# Patient Record
Sex: Male | Born: 1975 | Race: White | Hispanic: No | Marital: Single | Smoking: Never smoker
Health system: Southern US, Community
[De-identification: ages and names within clinical notes are randomized; demographics above are authoritative.]

## PROBLEM LIST (undated history)

## (undated) DIAGNOSIS — E785 Hyperlipidemia, unspecified: Secondary | ICD-10-CM

## (undated) DIAGNOSIS — K219 Gastro-esophageal reflux disease without esophagitis: Secondary | ICD-10-CM

## (undated) DIAGNOSIS — K921 Melena: Secondary | ICD-10-CM

## (undated) DIAGNOSIS — R748 Abnormal levels of other serum enzymes: Secondary | ICD-10-CM

## (undated) DIAGNOSIS — K5900 Constipation, unspecified: Secondary | ICD-10-CM

## (undated) DIAGNOSIS — T7840XA Allergy, unspecified, initial encounter: Secondary | ICD-10-CM

## (undated) HISTORY — DX: Hyperlipidemia, unspecified: E78.5

## (undated) HISTORY — DX: Gastro-esophageal reflux disease without esophagitis: K21.9

## (undated) HISTORY — DX: Constipation, unspecified: K59.00

## (undated) HISTORY — DX: Allergy, unspecified, initial encounter: T78.40XA

## (undated) HISTORY — DX: Abnormal levels of other serum enzymes: R74.8

## (undated) HISTORY — DX: Melena: K92.1

## (undated) HISTORY — PX: OTHER SURGICAL HISTORY: SHX169

---

## 1997-12-17 DIAGNOSIS — K921 Melena: Secondary | ICD-10-CM

## 1997-12-17 HISTORY — DX: Melena: K92.1

## 2004-12-27 ENCOUNTER — Ambulatory Visit: Payer: Self-pay | Admitting: Internal Medicine

## 2005-01-02 ENCOUNTER — Ambulatory Visit: Payer: Self-pay | Admitting: Internal Medicine

## 2005-01-23 ENCOUNTER — Ambulatory Visit: Payer: Self-pay | Admitting: Gastroenterology

## 2005-03-02 ENCOUNTER — Ambulatory Visit: Payer: Self-pay | Admitting: Internal Medicine

## 2005-04-30 ENCOUNTER — Ambulatory Visit: Payer: Self-pay | Admitting: Gastroenterology

## 2005-05-15 ENCOUNTER — Emergency Department (HOSPITAL_COMMUNITY): Admission: EM | Admit: 2005-05-15 | Discharge: 2005-05-16 | Payer: Self-pay | Admitting: Emergency Medicine

## 2006-12-09 ENCOUNTER — Emergency Department (HOSPITAL_COMMUNITY): Admission: EM | Admit: 2006-12-09 | Discharge: 2006-12-09 | Payer: Self-pay | Admitting: Emergency Medicine

## 2006-12-11 ENCOUNTER — Ambulatory Visit: Payer: Self-pay | Admitting: Gastroenterology

## 2009-01-07 ENCOUNTER — Ambulatory Visit: Payer: Self-pay | Admitting: Internal Medicine

## 2009-01-07 DIAGNOSIS — K5900 Constipation, unspecified: Secondary | ICD-10-CM | POA: Insufficient documentation

## 2009-01-07 LAB — CONVERTED CEMR LAB
ALT: 38 units/L (ref 0–53)
AST: 24 units/L (ref 0–37)
Albumin: 4.3 g/dL (ref 3.5–5.2)
Alkaline Phosphatase: 47 units/L (ref 39–117)
BUN: 16 mg/dL (ref 6–23)
Basophils Absolute: 0 10*3/uL (ref 0.0–0.1)
Basophils Relative: 0.5 % (ref 0.0–3.0)
Bilirubin, Direct: 0.1 mg/dL (ref 0.0–0.3)
CO2: 27 meq/L (ref 19–32)
Calcium: 9.5 mg/dL (ref 8.4–10.5)
Chloride: 103 meq/L (ref 96–112)
Cholesterol: 217 mg/dL (ref 0–200)
Creatinine, Ser: 0.8 mg/dL (ref 0.4–1.5)
Direct LDL: 158.8 mg/dL
Eosinophils Absolute: 0.2 10*3/uL (ref 0.0–0.7)
Eosinophils Relative: 3.6 % (ref 0.0–5.0)
GFR calc Af Amer: 146 mL/min
GFR calc non Af Amer: 121 mL/min
Glucose, Bld: 97 mg/dL (ref 70–99)
HCT: 43.5 % (ref 39.0–52.0)
HDL: 40.1 mg/dL (ref >39.0)
Hemoglobin: 14.8 g/dL (ref 13.0–17.0)
Lymphocytes Relative: 30 % (ref 12.0–46.0)
MCHC: 34.1 g/dL (ref 30.0–36.0)
MCV: 87 fL (ref 78.0–100.0)
Monocytes Absolute: 0.4 10*3/uL (ref 0.1–1.0)
Monocytes Relative: 6.5 % (ref 3.0–12.0)
Neutro Abs: 3.5 10*3/uL (ref 1.4–7.7)
Neutrophils Relative %: 59.4 % (ref 43.0–77.0)
Platelets: 261 10*3/uL (ref 150–400)
Potassium: 4 meq/L (ref 3.5–5.1)
RBC: 5 M/uL (ref 4.22–5.81)
RDW: 11.9 % (ref 11.5–14.6)
Sodium: 139 meq/L (ref 135–145)
TSH: 1.24 microintl units/mL (ref 0.35–5.50)
Total Bilirubin: 1 mg/dL (ref 0.3–1.2)
Total CHOL/HDL Ratio: 5.4
Total Protein: 7.4 g/dL (ref 6.0–8.3)
Triglycerides: 110 mg/dL (ref 0–149)
VLDL: 22 mg/dL (ref 0–40)
WBC: 5.8 10*3/uL (ref 4.5–10.5)

## 2011-05-04 NOTE — Assessment & Plan Note (Signed)
[facility]                         GASTROENTEROLOGY OFFICE NOTE   NAME:Stephen Gomez, Stephen Gomez                         MRN:          [id number]  DATE:12/11/2006                            DOB:          [birth year]    Stephen Gomez is a 35 year old white male that I previously evaluated for  constipation and small volume rectal bleeding.  See the last note from  Apr 30, 2005.  He underwent flexible sigmoidoscopy in February of 1999  which was normal.  He presented to [facility] Emergency Room on  December 09, 2006 with diarrhea, lower abdominal cramping, and rectal  bleeding.  His white count was elevated at 12.5 and his glucose was  elevated at 122, and the remainder of his blood work was unremarkable.  He was felt to have a gastroenteritis and hemorrhoids by the ED  physician.  He was treated with Cipro 500 mg p.o. b.i.d. and was  recommended to follow up with his primary physician, Dr. Artist Pais.  He has  had almost complete resolution of all his symptoms.  He continues on the  Cipro as prescribed.  He has no diarrhea, rectal bleeding, or abdominal  pain.  He has been gradually advancing his diet over the past 24 hours.  He notes no fevers or chills.   CURRENT MEDICATIONS:  Cipro 500 mg p.o. b.i.d.   MEDICATION ALLERGIES:  NONE KNOWN.   EXAMINATION:  In no acute distress, weight 152.2 pounds, blood pressure  124/64, pulse 82 and regular.  CHEST:  Clear to auscultation bilaterally.  CARDIAC:  Regular rate and rhythm without murmurs.  ABDOMEN:  Soft and nontender with normoactive bowel sounds, no palpable  organomegaly, masses, or hernias.   ASSESSMENT/PLAN:  1. Presumed recent infectious enterocolitis with associated bleeding-      possibly from hemrrhoids or colitis.  Since his symptoms have      completely resolved we will not plan for further work up.  If has      any return in symptoms we will plan for a colonoscopy for further      evaluation.  He  will call if his symptoms do not completely resolve      or they recur.  Complete Cipro as prescribed by the emergency room.  2. History of gastroesophageal reflux disease.  Symptoms currently      inactive.     Venita Lick. Russella Dar, MD, [facility]  Electronically Signed    MTS/MedQ  DD: 12/13/2006  DT: 12/13/2006  Job #: [id number]   cc:   Barbette Hair. Artist Pais, DO

## 2011-09-14 ENCOUNTER — Telehealth: Payer: Self-pay | Admitting: Internal Medicine

## 2011-09-14 NOTE — Telephone Encounter (Signed)
Pt wanted to come in for an injection with Dr Artist Pais pt wanted a TD shot  Should he get the Tdap? Also when can this be scheduled? Please advise

## 2011-09-17 NOTE — Telephone Encounter (Signed)
Pt needs tdap and he can come in at 1 pm.  May need to add an injection slot on Dr Olegario Messier schedule

## 2011-09-19 ENCOUNTER — Ambulatory Visit (INDEPENDENT_AMBULATORY_CARE_PROVIDER_SITE_OTHER): Payer: BC Managed Care – PPO | Admitting: Internal Medicine

## 2011-09-19 DIAGNOSIS — Z23 Encounter for immunization: Secondary | ICD-10-CM

## 2011-10-11 ENCOUNTER — Other Ambulatory Visit (INDEPENDENT_AMBULATORY_CARE_PROVIDER_SITE_OTHER): Payer: BC Managed Care – PPO

## 2011-10-11 DIAGNOSIS — Z0000 Encounter for general adult medical examination without abnormal findings: Secondary | ICD-10-CM

## 2011-10-11 LAB — LDL CHOLESTEROL, DIRECT: Direct LDL: 153 mg/dL

## 2011-10-11 LAB — CBC WITH DIFFERENTIAL/PLATELET
Basophils Absolute: 0.1 10*3/uL (ref 0.0–0.1)
Eosinophils Absolute: 0.2 10*3/uL (ref 0.0–0.7)
HCT: 40.4 % (ref 39.0–52.0)
Hemoglobin: 13.8 g/dL (ref 13.0–17.0)
Lymphs Abs: 1.7 10*3/uL (ref 0.7–4.0)
MCHC: 34.1 g/dL (ref 30.0–36.0)
Neutro Abs: 4.9 10*3/uL (ref 1.4–7.7)
RDW: 12.6 % (ref 11.5–14.6)

## 2011-10-11 LAB — POCT URINALYSIS DIPSTICK
Glucose, UA: NEGATIVE
Ketones, UA: NEGATIVE
Spec Grav, UA: 1.025
Urobilinogen, UA: 0.2

## 2011-10-11 LAB — HEPATIC FUNCTION PANEL
AST: 35 U/L (ref 0–37)
Albumin: 4.2 g/dL (ref 3.5–5.2)
Total Protein: 7.5 g/dL (ref 6.0–8.3)

## 2011-10-11 LAB — BASIC METABOLIC PANEL
CO2: 28 mEq/L (ref 19–32)
Chloride: 102 mEq/L (ref 96–112)
Glucose, Bld: 91 mg/dL (ref 70–99)
Potassium: 4.2 mEq/L (ref 3.5–5.1)
Sodium: 140 mEq/L (ref 135–145)

## 2011-10-11 LAB — LIPID PANEL
Cholesterol: 210 mg/dL — ABNORMAL HIGH (ref 0–200)
HDL: 43.6 mg/dL (ref >39.00)
VLDL: 37 mg/dL (ref 0.0–40.0)

## 2011-10-18 ENCOUNTER — Encounter: Payer: Self-pay | Admitting: Internal Medicine

## 2011-10-29 ENCOUNTER — Encounter: Payer: Self-pay | Admitting: Internal Medicine

## 2011-11-02 ENCOUNTER — Ambulatory Visit (INDEPENDENT_AMBULATORY_CARE_PROVIDER_SITE_OTHER): Payer: BC Managed Care – PPO | Admitting: Internal Medicine

## 2011-11-02 ENCOUNTER — Encounter: Payer: Self-pay | Admitting: Internal Medicine

## 2011-11-02 DIAGNOSIS — Z0000 Encounter for general adult medical examination without abnormal findings: Secondary | ICD-10-CM

## 2011-11-02 DIAGNOSIS — J309 Allergic rhinitis, unspecified: Secondary | ICD-10-CM | POA: Insufficient documentation

## 2011-11-02 NOTE — Assessment & Plan Note (Addendum)
Patient seen at urgent care for chronic cough which responded well to antihistamine and nasal steroids. We discussed potential testing for allergies next year.  (Blood test)  Pt currently asymptomatic. Patient advised to discontinue Flonase for now but use intranasal saline as maintenance treatment.

## 2011-11-02 NOTE — Assessment & Plan Note (Signed)
Reviewed adult health maintenance protocols. Patient is up-to-date with adult vaccines. Despite regular exercise patient's lipid panel is still elevated. Handout for following a low saturated fat diet provided. Obtain high sensitivity CRP at next lab visit.

## 2011-11-02 NOTE — Progress Notes (Signed)
  Subjective:    Patient ID: Stephen Gomez, male    DOB: 02/22/1976, 35 y.o.   MRN: [id number]  HPI 35 year old white male for routine physical.  No significant interval medical hx.  He reports treatment for chronic cough at urgent care.  His symptoms responded well to antihistamine and intranasal steroids. Cough attributed to allergic rhinitis.  He denies previous allergy testing. He does not recall whether symptoms are seasonal.  Lipid panel and other blood work reviewed in detail with patient.  Review of Systems  Constitutional: Negative for activity change, appetite change and unexpected weight change.  Eyes: Negative for visual disturbance.  Respiratory: Negative for cough, chest tightness and shortness of breath.   Cardiovascular: Negative for chest pain.  Genitourinary: Negative for difficulty urinating.  Neurological: Negative for headaches.  Gastrointestinal: Negative for abdominal pain, heartburn melena or hematochezia Psych: Negative for depression or anxiety Past Medical History  Diagnosis Date  . GERD (gastroesophageal reflux disease)   . Hematochezia 1999    History   Social History  . Marital Status: Married    Spouse Name: N/A    Number of Children: N/A  . Years of Education: N/A   Occupational History  . Not on file.   Social History Main Topics  . Smoking status: Never Smoker   . Smokeless tobacco: Not on file  . Alcohol Use: Yes  . Drug Use:   . Sexually Active:    Other Topics Concern  . Not on file   Social History Narrative  . No narrative on file    No past surgical history on file.  Family History  Problem Relation Age of Onset  . Hypertension Mother   . Thyroid disease Mother   . Hypertension Father   . Cancer Cousin     thyroid    No Known Allergies  No current outpatient prescriptions on file prior to visit.    BP 124/80  Pulse 114  Temp(Src) 98.2 F (36.8 C) (Oral)  Ht 5\' 6"  (1.676 m)  Wt 164 lb (74.39 kg)  BMI 26.47  kg/m2       Objective:   Physical Exam  Constitutional: He is oriented to person, place, and time. He appears well-developed and well-nourished.  HENT:  Head: Normocephalic and atraumatic.  Right Ear: External ear normal.  Left Ear: External ear normal.  Nose: Nose normal.  Mouth/Throat: Oropharynx is clear and moist.  Eyes: Conjunctivae are normal. Pupils are equal, round, and reactive to light.  Neck: Normal range of motion. Neck supple.  Cardiovascular: Normal rate, regular rhythm and normal heart sounds.  Exam reveals no gallop and no friction rub.   No murmur heard. Pulmonary/Chest: Effort normal and breath sounds normal. No respiratory distress. He has no wheezes.  Abdominal: Soft. Bowel sounds are normal. He exhibits no mass. There is no tenderness. There is no rebound.  Musculoskeletal: Normal range of motion.  Lymphadenopathy:    He has no cervical adenopathy.  Neurological: He is alert and oriented to person, place, and time.  Skin: Skin is warm and dry.  Psychiatric: He has a normal mood and affect. His behavior is normal.       Assessment & Plan:

## 2011-11-02 NOTE — Patient Instructions (Signed)
Please follow low saturated fat diet Please complete the following lab tests within 3 months LFTs- 790.4

## 2012-02-28 ENCOUNTER — Other Ambulatory Visit (INDEPENDENT_AMBULATORY_CARE_PROVIDER_SITE_OTHER): Payer: BC Managed Care – PPO

## 2012-02-28 LAB — HEPATIC FUNCTION PANEL
Albumin: 4.3 g/dL (ref 3.5–5.2)
Total Protein: 7.5 g/dL (ref 6.0–8.3)

## 2012-03-07 ENCOUNTER — Ambulatory Visit (INDEPENDENT_AMBULATORY_CARE_PROVIDER_SITE_OTHER): Payer: BC Managed Care – PPO | Admitting: Internal Medicine

## 2012-03-07 ENCOUNTER — Encounter: Payer: Self-pay | Admitting: Internal Medicine

## 2012-03-07 VITALS — BP 110/80 | Temp 98.0°F | Wt 162.0 lb

## 2012-03-07 DIAGNOSIS — L708 Other acne: Secondary | ICD-10-CM

## 2012-03-07 DIAGNOSIS — L989 Disorder of the skin and subcutaneous tissue, unspecified: Secondary | ICD-10-CM | POA: Insufficient documentation

## 2012-03-07 DIAGNOSIS — L819 Disorder of pigmentation, unspecified: Secondary | ICD-10-CM

## 2012-03-07 DIAGNOSIS — L709 Acne, unspecified: Secondary | ICD-10-CM | POA: Insufficient documentation

## 2012-03-07 MED ORDER — MINOCYCLINE HCL 50 MG PO CAPS: 50.0000 mg | ORAL_CAPSULE | Freq: Every day | ORAL | Status: DC

## 2012-03-07 NOTE — Progress Notes (Signed)
  Subjective:    Patient ID: Stephen Gomez, male    DOB: [birth year], 36 y.o.   MRN: [id number]  HPI  36 year old white male complains of enlarging hyperpigmented lesions on his back. No other associated symptoms.  Patient has also struggled with acne on his back. He has used over-the-counter body washes without any significant improvement. He has not tried using antibiotics in the past. He denies previous reactions to tetracycline.  Past Medical History  Diagnosis Date  . GERD (gastroesophageal reflux disease)   . Hematochezia 1999    History   Social History  . Marital Status: Married    Spouse Name: N/A    Number of Children: N/A  . Years of Education: N/A   Occupational History  . Not on file.   Social History Main Topics  . Smoking status: Never Smoker   . Smokeless tobacco: Not on file  . Alcohol Use: Yes  . Drug Use:   . Sexually Active:    Other Topics Concern  . Not on file   Social History Narrative  . No narrative on file    Past Surgical History  Procedure Date  . Hyperlipidemia     Family History  Problem Relation Age of Onset  . Hypertension Mother   . Thyroid disease Mother   . Hypertension Father   . Cancer Cousin     thyroid    No Known Allergies  Current Outpatient Prescriptions on File Prior to Visit  Medication Sig Dispense Refill  . cetirizine (ZYRTEC) 10 MG tablet Take 10 mg by mouth daily.        . fluticasone (FLONASE) 50 MCG/ACT nasal spray Place 2 sprays into the nose daily.          BP 110/80  Temp(Src) 98 F (36.7 C) (Oral)  Wt 162 lb (73.483 kg)      Review of Systems     Objective:   Physical Exam  Constitutional: He appears well-developed and well-nourished.  Cardiovascular: Normal rate, regular rhythm and normal heart sounds.   Pulmonary/Chest: Effort normal and breath sounds normal. He has no wheezes.  Skin:          2 lesions - approx 8 mm in size  Scattered cystic acne of his back            Assessment & Plan:

## 2012-03-07 NOTE — Assessment & Plan Note (Signed)
36 year old white male with enlarging skin lesions on his back. Rule out malignancy. She had biopsy performed under sterile technique. 0.2 cc 1% lidocaine with epi used for anesthesia. No complications. Aftercare discussed.

## 2012-03-07 NOTE — Assessment & Plan Note (Signed)
Patient with chronic acne on his back. He has tried and failed over-the-counter body washes. Treat with minocycline 50 mg once daily.  Reassess in one month.

## 2012-03-26 ENCOUNTER — Encounter: Payer: Self-pay | Admitting: Internal Medicine

## 2012-03-26 ENCOUNTER — Telehealth: Payer: Self-pay | Admitting: Internal Medicine

## 2012-03-26 ENCOUNTER — Ambulatory Visit (INDEPENDENT_AMBULATORY_CARE_PROVIDER_SITE_OTHER): Payer: BC Managed Care – PPO | Admitting: Internal Medicine

## 2012-03-26 VITALS — BP 104/80 | Temp 98.1°F | Wt 169.0 lb

## 2012-03-26 DIAGNOSIS — L259 Unspecified contact dermatitis, unspecified cause: Secondary | ICD-10-CM

## 2012-03-26 DIAGNOSIS — I776 Arteritis, unspecified: Secondary | ICD-10-CM

## 2012-03-26 DIAGNOSIS — L309 Dermatitis, unspecified: Secondary | ICD-10-CM | POA: Insufficient documentation

## 2012-03-26 LAB — CBC WITH DIFFERENTIAL/PLATELET
Basophils Relative: 0.1 % (ref 0.0–3.0)
Eosinophils Relative: 1.1 % (ref 0.0–5.0)
HCT: 40.7 % (ref 39.0–52.0)
Hemoglobin: 14.1 g/dL (ref 13.0–17.0)
Lymphs Abs: 1 10*3/uL (ref 0.7–4.0)
MCHC: 34.6 g/dL (ref 30.0–36.0)
MCV: 86.5 fl (ref 78.0–100.0)
Monocytes Absolute: 0.4 10*3/uL (ref 0.1–1.0)
Neutro Abs: 6.6 10*3/uL (ref 1.4–7.7)
Neutrophils Relative %: 82.3 % — ABNORMAL HIGH (ref 43.0–77.0)
RBC: 4.71 Mil/uL (ref 4.22–5.81)
WBC: 8 10*3/uL (ref 4.5–10.5)

## 2012-03-26 LAB — BASIC METABOLIC PANEL
CO2: 25 mEq/L (ref 19–32)
Chloride: 101 mEq/L (ref 96–112)
Potassium: 4.2 mEq/L (ref 3.5–5.1)
Sodium: 138 mEq/L (ref 135–145)

## 2012-03-26 MED ORDER — HYDROXYZINE HCL 10 MG PO TABS: 10.0000 mg | ORAL_TABLET | Freq: Three times a day (TID) | ORAL | Status: AC | PRN

## 2012-03-26 NOTE — Assessment & Plan Note (Addendum)
Unexplained palmar rash and 36 year old white male. Consider vasculitis. Discontinue minocycline. Refer to dermatology for further evaluation and treatment.  Use atarax for pruritus.

## 2012-03-26 NOTE — Patient Instructions (Signed)
Call our office if your rash gets worse. Stop minocycline

## 2012-03-26 NOTE — Telephone Encounter (Signed)
Opened in error. Pt decided to make ov instead.

## 2012-03-26 NOTE — Progress Notes (Signed)
  Subjective:    Patient ID: Stephen Gomez, male    DOB: [birth year], 36 y.o.   MRN: [id number]  HPI  36 year old white male complains of new onset rash mainly on his hands. Rash involves palms. He initially noticed moderate itching. No new environmental exposures. He was started on minocycline approximately 2-3 weeks ago for acne of his back. He also notes mild redness of the right elbow and both knees.  Review of Systems    negative for fever or chills  Past Medical History  Diagnosis Date  . GERD (gastroesophageal reflux disease)   . Hematochezia 1999    History   Social History  . Marital Status: Married    Spouse Name: N/A    Number of Children: N/A  . Years of Education: N/A   Occupational History  . Not on file.   Social History Main Topics  . Smoking status: Never Smoker   . Smokeless tobacco: Not on file  . Alcohol Use: Yes  . Drug Use:   . Sexually Active:    Other Topics Concern  . Not on file   Social History Narrative  . No narrative on file    Past Surgical History  Procedure Date  . Hyperlipidemia     Family History  Problem Relation Age of Onset  . Hypertension Mother   . Thyroid disease Mother   . Hypertension Father   . Cancer Cousin     thyroid    No Known Allergies  Current Outpatient Prescriptions on File Prior to Visit  Medication Sig Dispense Refill  . cetirizine (ZYRTEC) 10 MG tablet Take 10 mg by mouth daily.        . fluticasone (FLONASE) 50 MCG/ACT nasal spray Place 2 sprays into the nose daily.          BP 104/80  Temp(Src) 98.1 F (36.7 C) (Oral)  Wt 169 lb (76.658 kg)    Objective:   Physical Exam  Constitutional: He appears well-developed and well-nourished.  Cardiovascular: Normal rate, regular rhythm and normal heart sounds.   Pulmonary/Chest: Breath sounds normal. He has no wheezes. He has no rales.  Musculoskeletal:       Mild swelling of right hand  Skin:       Erythematous plaques on bilateral palms. Lacy  appearance on thumbs. Mild redness to extensor surface of right elbow and bilateral knees.          Assessment & Plan:

## 2012-03-27 LAB — ANA: Anti Nuclear Antibody(ANA): NEGATIVE

## 2013-08-07 ENCOUNTER — Encounter: Payer: Self-pay | Admitting: Internal Medicine

## 2013-08-07 ENCOUNTER — Other Ambulatory Visit (INDEPENDENT_AMBULATORY_CARE_PROVIDER_SITE_OTHER): Payer: BC Managed Care – PPO

## 2013-08-07 DIAGNOSIS — R7989 Other specified abnormal findings of blood chemistry: Secondary | ICD-10-CM

## 2013-08-07 DIAGNOSIS — Z0000 Encounter for general adult medical examination without abnormal findings: Secondary | ICD-10-CM

## 2013-08-07 LAB — POCT URINALYSIS DIPSTICK
Bilirubin, UA: NEGATIVE
Ketones, UA: NEGATIVE
Leukocytes, UA: NEGATIVE
Nitrite, UA: NEGATIVE
pH, UA: 7.5

## 2013-08-07 LAB — CBC WITH DIFFERENTIAL/PLATELET
Basophils Relative: 0.7 % (ref 0.0–3.0)
Eosinophils Absolute: 0.1 10*3/uL (ref 0.0–0.7)
Eosinophils Relative: 2 % (ref 0.0–5.0)
HCT: 43.3 % (ref 39.0–52.0)
Hemoglobin: 15.1 g/dL (ref 13.0–17.0)
Lymphs Abs: 1.8 10*3/uL (ref 0.7–4.0)
MCHC: 34.8 g/dL (ref 30.0–36.0)
MCV: 85.8 fl (ref 78.0–100.0)
Monocytes Absolute: 0.4 10*3/uL (ref 0.1–1.0)
Neutro Abs: 3.2 10*3/uL (ref 1.4–7.7)
RBC: 5.04 Mil/uL (ref 4.22–5.81)
WBC: 5.6 10*3/uL (ref 4.5–10.5)

## 2013-08-07 LAB — LIPID PANEL
Total CHOL/HDL Ratio: 6
Triglycerides: 214 mg/dL — ABNORMAL HIGH (ref 0.0–149.0)

## 2013-08-07 LAB — BASIC METABOLIC PANEL
CO2: 29 mEq/L (ref 19–32)
Chloride: 101 mEq/L (ref 96–112)
Creatinine, Ser: 0.9 mg/dL (ref 0.4–1.5)
Potassium: 3.8 mEq/L (ref 3.5–5.1)
Sodium: 136 mEq/L (ref 135–145)

## 2013-08-07 LAB — HEPATIC FUNCTION PANEL
Albumin: 4.4 g/dL (ref 3.5–5.2)
Alkaline Phosphatase: 44 U/L (ref 39–117)
Bilirubin, Direct: 0 mg/dL (ref 0.0–0.3)
Total Protein: 7.8 g/dL (ref 6.0–8.3)

## 2013-08-07 LAB — TSH: TSH: 1.21 u[IU]/mL (ref 0.35–5.50)

## 2013-08-14 ENCOUNTER — Ambulatory Visit (INDEPENDENT_AMBULATORY_CARE_PROVIDER_SITE_OTHER): Payer: BC Managed Care – PPO | Admitting: Internal Medicine

## 2013-08-14 ENCOUNTER — Encounter: Payer: Self-pay | Admitting: Internal Medicine

## 2013-08-14 VITALS — BP 120/90 | HR 92 | Temp 97.9°F | Ht 67.0 in | Wt 163.0 lb

## 2013-08-14 DIAGNOSIS — Z0000 Encounter for general adult medical examination without abnormal findings: Secondary | ICD-10-CM

## 2013-08-14 NOTE — Assessment & Plan Note (Signed)
Reviewed adult health maintenance protocols.  Healthy low saturated diet encouraged.  Patient will get flu vaccine through employer.

## 2013-08-14 NOTE — Progress Notes (Signed)
  Subjective:    Patient ID: Stephen Gomez, male    DOB: [birth year], 37 y.o.   MRN: [id number]  HPI  37 year old white male for routine physical. He denies significant interval medical history. He was seen by dermatologist for rash on palms and soles. This was felt to be secondary to reaction to tetracycline.  Patient had health screening at work. His lipid panel is elevated. He is single and follows a suboptimal diet.  Review of Systems  Constitutional: Negative for activity change, appetite change and unexpected weight change.  Eyes: Negative for visual disturbance.  Respiratory: Negative for cough, chest tightness and shortness of breath.   Cardiovascular: Negative for chest pain.  Genitourinary: Negative for difficulty urinating.  Neurological: Negative for headaches.  Gastrointestinal: Negative for abdominal pain, heartburn melena or hematochezia Psych: Negative for depression or anxiety         Past Medical History  Diagnosis Date  . GERD (gastroesophageal reflux disease)   . Hematochezia 1999    History   Social History  . Marital Status: Married    Spouse Name: N/A    Number of Children: N/A  . Years of Education: N/A   Occupational History  . Not on file.   Social History Main Topics  . Smoking status: Never Smoker   . Smokeless tobacco: Not on file  . Alcohol Use: Yes  . Drug Use:   . Sexual Activity:    Other Topics Concern  . Not on file   Social History Narrative  . No narrative on file    Past Surgical History  Procedure Laterality Date  . Hyperlipidemia      Family History  Problem Relation Age of Onset  . Hypertension Mother   . Thyroid disease Mother   . Hypertension Father   . Cancer Cousin     thyroid    No Known Allergies  Current Outpatient Prescriptions on File Prior to Visit  Medication Sig Dispense Refill  . cetirizine (ZYRTEC) 10 MG tablet Take 10 mg by mouth daily.        . fluticasone (FLONASE) 50 MCG/ACT nasal spray  Place 2 sprays into the nose daily.         No current facility-administered medications on file prior to visit.    BP 120/90  Pulse 92  Temp(Src) 97.9 F (36.6 C) (Oral)  Ht 5\' 7"  (1.702 m)  Wt 163 lb (73.936 kg)  BMI 25.52 kg/m2    Objective:   Physical Exam  Constitutional: He is oriented to person, place, and time. He appears well-developed and well-nourished.  HENT:  Head: Normocephalic and atraumatic.  Right Ear: External ear normal.  Left Ear: External ear normal.  Mouth/Throat: Oropharynx is clear and moist.  Cardiovascular: Normal rate, regular rhythm and normal heart sounds.   No murmur heard. Pulmonary/Chest: Effort normal and breath sounds normal. He has no wheezes.  Abdominal: Soft. Bowel sounds are normal. He exhibits no mass. There is no tenderness.  Musculoskeletal: Normal range of motion. He exhibits no edema.  Lymphadenopathy:    He has no cervical adenopathy.  Neurological: He is alert and oriented to person, place, and time. No cranial nerve deficit.  Skin: Skin is warm and dry. No rash noted.  Psychiatric: He has a normal mood and affect. His behavior is normal.          Assessment & Plan:

## 2013-08-14 NOTE — Patient Instructions (Addendum)
Exercise 3 to 4 times per week as directed See handout on low saturated fat diet. You can also explore Mediterranean diet Please complete the following lab tests before your next follow up appointment: CPX labs - V70

## 2013-12-14 ENCOUNTER — Ambulatory Visit: Payer: BC Managed Care – PPO

## 2013-12-14 ENCOUNTER — Encounter: Payer: Self-pay | Admitting: Physician Assistant

## 2013-12-14 ENCOUNTER — Ambulatory Visit: Payer: BC Managed Care – PPO | Admitting: Physician Assistant

## 2013-12-14 VITALS — BP 110/72 | HR 99 | Temp 98.8°F | Resp 18 | Ht 66.0 in | Wt 160.0 lb

## 2013-12-14 DIAGNOSIS — R062 Wheezing: Secondary | ICD-10-CM

## 2013-12-14 DIAGNOSIS — R05 Cough: Secondary | ICD-10-CM

## 2013-12-14 LAB — POCT CBC
HCT, POC: 46.7 % (ref 43.5–53.7)
Hemoglobin: 14.6 g/dL (ref 14.1–18.1)
Lymph, poc: 1.6 (ref 0.6–3.4)
MCH, POC: 28.7 pg (ref 27–31.2)
MCHC: 31.3 g/dL — AB (ref 31.8–35.4)
POC LYMPH PERCENT: 25.4 %L (ref 10–50)
RDW, POC: 12.7 %
WBC: 6.2 10*3/uL (ref 4.6–10.2)

## 2013-12-14 MED ORDER — ALBUTEROL SULFATE (2.5 MG/3ML) 0.083% IN NEBU: 2.5000 mg | INHALATION_SOLUTION | Freq: Once | RESPIRATORY_TRACT | Status: DC

## 2013-12-14 MED ORDER — PREDNISONE 20 MG PO TABS: ORAL_TABLET | ORAL | Status: DC

## 2013-12-14 MED ORDER — HYDROCOD POLST-CHLORPHEN POLST 10-8 MG/5ML PO LQCR: 5.0000 mL | Freq: Two times a day (BID) | ORAL | Status: DC | PRN

## 2013-12-14 MED ORDER — IPRATROPIUM BROMIDE 0.02 % IN SOLN: 0.5000 mg | Freq: Once | RESPIRATORY_TRACT | Status: DC

## 2013-12-14 NOTE — Patient Instructions (Signed)
Get plenty of rest and drink at least 64 ounces of water daily. 

## 2013-12-14 NOTE — Progress Notes (Signed)
Subjective:    Patient ID: Stephen Gomez, male    DOB: 1975/12/25, 37 y.o.   MRN: [id number]  PCP: Thomos Lemons, DO  Chief Complaint  Patient presents with  . Cough    x4 days ,[facility]   . chest congestion   Medications, allergies, past medical history, surgical history, family history, social history and problem list reviewed and updated.  HPI  "Really sick for the past 4 days.  Before that I had a little bit of a cough, maybe as Dalia as a month. My chest feels full."  Tired, coughing, body aches, nasal congestion/drainage, sore throat (feels dry); subjective fever/chills. No ear pain/fullness. Cough is non-productive, for the most part, though this morning he coughed up a small amount of whitish mucous. No nausea, vomiting or diarrhea.  He began to feel some better today, except for the cough, which seems worse.  Every deep breath in and he coughs.  He presented to [facility], where a flu test was negative and he was advised to come here for additional evaluation.  Review of Systems As above.    Objective:   Physical Exam  Blood pressure 110/72, pulse 99, temperature 98.8 F (37.1 C), temperature source Oral, resp. rate 18, height 5\' 6"  (1.676 m), weight 160 lb (72.576 kg), SpO2 96.00%. Body mass index is 25.84 kg/(m^2). Well-developed, well nourished WM who is awake, alert and oriented, in NAD. HEENT: [place]/AT, PERRL, EOMI.  Sclera and conjunctiva are clear.  EAC are patent, TMs are normal in appearance. Nasal mucosa is mildly congested, pink and moist. OP is clear. Neck: supple, non-tender, no lymphadenopathy, thyromegaly. Heart: RRR, no murmur Lungs: normal effort, with soft wheezes throughout. Extremities: no cyanosis, clubbing or edema. Skin: warm and dry without rash. Psychologic: good mood and appropriate affect, normal speech and behavior.   CXR: [facility] reading (PRIMARY) by  Dr. Merla Riches. Normal CXR, without  infiltrate or pneumothorax.  Results for orders placed in visit on 12/14/13  POCT CBC      Result Value Range   WBC 6.2  4.6 - 10.2 K/uL   Lymph, poc 1.6  0.6 - 3.4   POC LYMPH PERCENT 25.4  10 - 50 %L   MID (cbc) 0.4  0 - 0.9   POC MID % 6.1  0 - 12 %M   POC Granulocyte 4.2  2 - 6.9   Granulocyte percent 68.5  37 - 80 %G   RBC 5.09  4.69 - 6.13 M/uL   Hemoglobin 14.6  14.1 - 18.1 g/dL   HCT, POC 95.2  84.1 - 53.7 %   MCV 91.7  80 - 97 fL   MCH, POC 28.7  27 - 31.2 pg   MCHC 31.3 (*) 31.8 - 35.4 g/dL   RDW, POC 32.4     Platelet Count, POC 245  142 - 424 K/uL   MPV 8.7  0 - 99.8 fL   Wheezing resolved, and subjective symptoms improved with albuterol + atrovent neb treatment.    Assessment & Plan:  1. Cough Likely reactive, secondary to recent illnesses. - POCT CBC - DG Chest 2 View; Future - chlorpheniramine-HYDROcodone (TUSSIONEX PENNKINETIC ER) 10-8 MG/5ML LQCR; Take 5 mLs by mouth every 12 (twelve) hours as needed for cough (cough).  Dispense: 100 mL; Refill: 0  2. Wheezing See above. - albuterol (PROVENTIL) (2.5 MG/3ML) 0.083% nebulizer solution 2.5 mg; Take 3 mLs (2.5 mg total) by nebulization  once. - ipratropium (ATROVENT) nebulizer solution 0.5 mg; Take 2.5 mLs (0.5 mg total) by nebulization once. - predniSONE (DELTASONE) 20 MG tablet; Take 3 PO QAM x3days, 2 PO QAM x3days, 1 PO QAM x3days  Dispense: 18 tablet; Refill: 0   Fernande Bras, PA-C Physician Assistant-Certified [facility]

## 2014-08-24 ENCOUNTER — Other Ambulatory Visit (INDEPENDENT_AMBULATORY_CARE_PROVIDER_SITE_OTHER): Payer: BC Managed Care – PPO

## 2014-08-24 DIAGNOSIS — Z0000 Encounter for general adult medical examination without abnormal findings: Secondary | ICD-10-CM

## 2014-08-24 DIAGNOSIS — R7989 Other specified abnormal findings of blood chemistry: Secondary | ICD-10-CM

## 2014-08-24 LAB — HEPATIC FUNCTION PANEL
ALK PHOS: 45 U/L (ref 39–117)
ALT: 136 U/L — AB (ref 0–53)
AST: 63 U/L — ABNORMAL HIGH (ref 0–37)
Albumin: 4.2 g/dL (ref 3.5–5.2)
BILIRUBIN DIRECT: 0.1 mg/dL (ref 0.0–0.3)
Total Bilirubin: 0.8 mg/dL (ref 0.2–1.2)
Total Protein: 7.5 g/dL (ref 6.0–8.3)

## 2014-08-24 LAB — BASIC METABOLIC PANEL
BUN: 16 mg/dL (ref 6–23)
CO2: 24 mEq/L (ref 19–32)
Calcium: 9 mg/dL (ref 8.4–10.5)
Chloride: 101 mEq/L (ref 96–112)
Creatinine, Ser: 0.8 mg/dL (ref 0.4–1.5)
GFR: 117.93 mL/min (ref >60.00)
Glucose, Bld: 93 mg/dL (ref 70–99)
POTASSIUM: 3.6 meq/L (ref 3.5–5.1)
SODIUM: 135 meq/L (ref 135–145)

## 2014-08-24 LAB — LIPID PANEL
CHOL/HDL RATIO: 7
Cholesterol: 220 mg/dL — ABNORMAL HIGH (ref 0–200)
HDL: 33.1 mg/dL — ABNORMAL LOW (ref >39.00)
NONHDL: 186.9
Triglycerides: 311 mg/dL — ABNORMAL HIGH (ref 0.0–149.0)
VLDL: 62.2 mg/dL — ABNORMAL HIGH (ref 0.0–40.0)

## 2014-08-24 LAB — CBC WITH DIFFERENTIAL/PLATELET
Basophils Absolute: 0 10*3/uL (ref 0.0–0.1)
Basophils Relative: 0.6 % (ref 0.0–3.0)
EOS PCT: 1.1 % (ref 0.0–5.0)
Eosinophils Absolute: 0.1 10*3/uL (ref 0.0–0.7)
HEMATOCRIT: 41.5 % (ref 39.0–52.0)
Hemoglobin: 14.3 g/dL (ref 13.0–17.0)
LYMPHS PCT: 25.9 % (ref 12.0–46.0)
Lymphs Abs: 1.6 10*3/uL (ref 0.7–4.0)
MCHC: 34.4 g/dL (ref 30.0–36.0)
MCV: 86.3 fl (ref 78.0–100.0)
MONOS PCT: 5.5 % (ref 3.0–12.0)
Monocytes Absolute: 0.3 10*3/uL (ref 0.1–1.0)
Neutro Abs: 4.1 10*3/uL (ref 1.4–7.7)
Neutrophils Relative %: 66.9 % (ref 43.0–77.0)
Platelets: 238 10*3/uL (ref 150.0–400.0)
RBC: 4.8 Mil/uL (ref 4.22–5.81)
RDW: 12.4 % (ref 11.5–15.5)
WBC: 6.1 10*3/uL (ref 4.0–10.5)

## 2014-08-24 LAB — POCT URINALYSIS DIPSTICK
Bilirubin, UA: NEGATIVE
Blood, UA: NEGATIVE
GLUCOSE UA: NEGATIVE
Ketones, UA: NEGATIVE
LEUKOCYTES UA: NEGATIVE
NITRITE UA: NEGATIVE
PH UA: 5.5
Protein, UA: NEGATIVE
Spec Grav, UA: <=1.005
Urobilinogen, UA: 0.2

## 2014-08-24 LAB — LDL CHOLESTEROL, DIRECT: Direct LDL: 141.8 mg/dL

## 2014-08-24 LAB — TSH: TSH: 1.04 u[IU]/mL (ref 0.35–4.50)

## 2014-08-31 ENCOUNTER — Encounter: Payer: BC Managed Care – PPO | Admitting: Internal Medicine

## 2014-09-07 ENCOUNTER — Other Ambulatory Visit: Payer: Self-pay | Admitting: Internal Medicine

## 2014-09-07 DIAGNOSIS — R7989 Other specified abnormal findings of blood chemistry: Secondary | ICD-10-CM

## 2014-09-07 DIAGNOSIS — R945 Abnormal results of liver function studies: Principal | ICD-10-CM

## 2014-09-14 ENCOUNTER — Other Ambulatory Visit (INDEPENDENT_AMBULATORY_CARE_PROVIDER_SITE_OTHER): Payer: BC Managed Care – PPO

## 2014-09-14 DIAGNOSIS — R748 Abnormal levels of other serum enzymes: Secondary | ICD-10-CM

## 2014-09-14 LAB — IBC PANEL
Iron: 105 ug/dL (ref 42–165)
Saturation Ratios: 30.4 % (ref 20.0–50.0)
Transferrin: 246.7 mg/dL (ref 212.0–360.0)

## 2014-09-14 LAB — FERRITIN: Ferritin: 380.2 ng/mL — ABNORMAL HIGH (ref 22.0–322.0)

## 2014-09-15 LAB — HEPATITIS C ANTIBODY: HCV Ab: NEGATIVE

## 2014-09-15 LAB — HEPATITIS B SURFACE ANTIBODY,QUALITATIVE

## 2014-09-15 LAB — HEPATITIS B SURFACE ANTIGEN: Hepatitis B Surface Ag: NEGATIVE

## 2014-09-15 LAB — ANA: ANA: NEGATIVE

## 2014-09-16 ENCOUNTER — Ambulatory Visit
Admission: RE | Admit: 2014-09-16 | Discharge: 2014-09-16 | Disposition: A | Discharge status: Home / Self Care | Payer: BC Managed Care – PPO | Source: Ambulatory Visit | Attending: Internal Medicine | Admitting: Internal Medicine

## 2014-09-16 DIAGNOSIS — R7989 Other specified abnormal findings of blood chemistry: Secondary | ICD-10-CM

## 2014-09-16 DIAGNOSIS — R945 Abnormal results of liver function studies: Principal | ICD-10-CM

## 2014-09-16 LAB — PROTEIN ELECTROPHORESIS, SERUM
ALPHA-1-GLOBULIN: 10.6 % — AB (ref 2.9–4.9)
Albumin ELP: 58.2 % (ref 55.8–66.1)
Alpha-2-Globulin: 7.4 % (ref 7.1–11.8)
Beta 2: 3.9 % (ref 3.2–6.5)
Beta Globulin: 7 % (ref 4.7–7.2)
GAMMA GLOBULIN: 12.9 % (ref 11.1–18.8)
TOTAL PROTEIN, SERUM ELECTROPHOR: 7.1 g/dL (ref 6.0–8.3)

## 2014-09-16 LAB — CERULOPLASMIN: CERULOPLASMIN: 22 mg/dL (ref 18–36)

## 2014-09-23 ENCOUNTER — Encounter: Payer: BC Managed Care – PPO | Admitting: Internal Medicine

## 2014-11-04 ENCOUNTER — Encounter: Payer: BC Managed Care – PPO | Admitting: Internal Medicine

## 2014-11-05 ENCOUNTER — Ambulatory Visit (INDEPENDENT_AMBULATORY_CARE_PROVIDER_SITE_OTHER): Payer: BC Managed Care – PPO | Admitting: Internal Medicine

## 2014-11-05 ENCOUNTER — Encounter: Payer: Self-pay | Admitting: Internal Medicine

## 2014-11-05 VITALS — BP 114/80 | HR 100 | Temp 98.5°F | Ht 66.75 in | Wt 176.0 lb

## 2014-11-05 DIAGNOSIS — R945 Abnormal results of liver function studies: Secondary | ICD-10-CM

## 2014-11-05 DIAGNOSIS — Z0000 Encounter for general adult medical examination without abnormal findings: Secondary | ICD-10-CM

## 2014-11-05 DIAGNOSIS — R7989 Other specified abnormal findings of blood chemistry: Secondary | ICD-10-CM

## 2014-11-05 DIAGNOSIS — R42 Dizziness and giddiness: Secondary | ICD-10-CM

## 2014-11-05 NOTE — Assessment & Plan Note (Signed)
Reviewed adult health maintenance protocols.  Weight loss and regular exercise encouraged. He has history of fatty liver. Patient advised to discontinue all soft drinks and decreased intake of concentrated sweets. He seldom consumes alcohol.  No family history of liver disease. His ferritin level is slightly elevated. Repeat liver function tests in 3 months. Obtain genetic testing for hemochromatosis.

## 2014-11-05 NOTE — Progress Notes (Signed)
Subjective:    Patient ID: Stephen Gomez, male    DOB: [birth year], 38 y.o.   MRN: [id number]  HPI  38 year old white male with history of gastroesophageal reflux disease, abnormal LFTs and fatty liver for routine physical. Patient denies significant interval medical history. Patient exercising more often. He has made some dietary changes but still consumes soft drinks.  Patient reports he has experienced intermittent dizziness with strenuous aerobic exercise. He denies any prior history. Patient recently started exercising.  No history of syncope with exercise.  Review of Systems  Constitutional: Negative for activity change, appetite change and unexpected weight change.  Eyes: Negative for visual disturbance.  Respiratory: Negative for cough, chest tightness and shortness of breath.   Cardiovascular: Negative for chest pain.  Genitourinary: Negative for difficulty urinating.  Neurological: Negative for headaches.  Gastrointestinal: Negative for abdominal pain, heartburn melena or hematochezia Psych: Negative for depression or anxiety Endo:  No polyuria or polydypsia    Past Medical History  Diagnosis Date  . GERD (gastroesophageal reflux disease)   . Hematochezia 1999    History   Social History  . Marital Status: Single    Spouse Name: n/a    Number of Children: 0  . Years of Education: College   Occupational History  . [employer]     [employer]   Social History Main Topics  . Smoking status: Never Smoker   . Smokeless tobacco: Never Used  . Alcohol Use: Yes     Comment: on weekends only, once a month  . Drug Use: No  . Sexual Activity: Not on file   Other Topics Concern  . Not on file   Social History Narrative   Single       No past surgical history on file.  Family History  Problem Relation Age of Onset  . Hypertension Mother   . Thyroid disease Mother   . Cancer Mother     GYN  . Hypertension Father   . Cancer Cousin     thyroid  . Liver disease Neg  Hx     Allergies  Allergen Reactions  . Tetracyclines & Related Rash    No current outpatient prescriptions on file prior to visit.   No current facility-administered medications on file prior to visit.    BP 114/80 mmHg  Pulse 100  Temp(Src) 98.5 F (36.9 C) (Oral)  Ht 5' 6.75" (1.695 m)  Wt 176 lb (79.833 kg)  BMI 27.79 kg/m2     Objective:   Physical Exam  Constitutional: He is oriented to person, place, and time. He appears well-developed and well-nourished. No distress.  HENT:  Head: Normocephalic and atraumatic.  Right Ear: External ear normal.  Left Ear: External ear normal.  Mouth/Throat: Oropharynx is clear and moist.  Eyes: Conjunctivae and EOM are normal. Pupils are equal, round, and reactive to light. No scleral icterus.  Neck: Normal range of motion. Neck supple.  Cardiovascular: Normal rate, regular rhythm and normal heart sounds.   No murmur heard. No murmur with left hand grip or Valsalva maneuver  Pulmonary/Chest: Effort normal and breath sounds normal. He has no wheezes.  Abdominal: Soft. Bowel sounds are normal. He exhibits no distension and no mass. There is no tenderness.  Negative for organomegaly  Musculoskeletal: Normal range of motion. He exhibits no edema or tenderness.  Lymphadenopathy:    He has no cervical adenopathy.  Neurological: He is alert and oriented to person, place, and time. No cranial nerve deficit. He exhibits  normal muscle tone. Coordination normal.  Skin: Skin is warm and dry.  Psychiatric: He has a normal mood and affect. His behavior is normal.          Assessment & Plan:

## 2014-11-05 NOTE — Assessment & Plan Note (Signed)
Screening for chronic infectious hepatitis negative. ANA and SPEP also negative. Iron saturation normal but ferritin level slightly elevated. Obtain genetic testing for hemochromatosis.  Liver ultrasound showed fatty liver.  Elevated transaminases likely secondary to fatty liver.  Dietary changes and regular exercise encouraged. Repeat LFTs in 3 months.

## 2014-11-05 NOTE — Progress Notes (Signed)
Pre visit review using our clinic review tool, if applicable. No additional management support is needed unless otherwise documented below in the visit note. 

## 2014-11-05 NOTE — Assessment & Plan Note (Signed)
38 year old white male complains of mild intermittent dizziness with strenuous aerobic exercise. He recently started exercising. His cardiac exam and EKG unremarkable. Patient advised to gradually increase intensity of aerobic exercise.  If persistent symptoms, consider further cardiac testing.

## 2014-11-05 NOTE — Patient Instructions (Addendum)
Please complete the following lab tests before your next follow up appointment: LFTs - 790.4 Hemochromatosis genetic testing - 790.4

## 2015-10-25 ENCOUNTER — Other Ambulatory Visit (INDEPENDENT_AMBULATORY_CARE_PROVIDER_SITE_OTHER): Payer: BLUE CROSS/BLUE SHIELD

## 2015-10-25 DIAGNOSIS — Z0000 Encounter for general adult medical examination without abnormal findings: Secondary | ICD-10-CM | POA: Diagnosis not present

## 2015-10-25 LAB — HEPATIC FUNCTION PANEL
ALK PHOS: 52 U/L (ref 39–117)
ALT: 103 U/L — AB (ref 0–53)
AST: 44 U/L — AB (ref 0–37)
Albumin: 4.7 g/dL (ref 3.5–5.2)
Bilirubin, Direct: 0.1 mg/dL (ref 0.0–0.3)
Total Bilirubin: 0.8 mg/dL (ref 0.2–1.2)
Total Protein: 7.4 g/dL (ref 6.0–8.3)

## 2015-10-25 LAB — CBC WITH DIFFERENTIAL/PLATELET
BASOS ABS: 0 10*3/uL (ref 0.0–0.1)
Basophils Relative: 0.6 % (ref 0.0–3.0)
EOS PCT: 2.5 % (ref 0.0–5.0)
Eosinophils Absolute: 0.1 10*3/uL (ref 0.0–0.7)
HCT: 44 % (ref 39.0–52.0)
HEMOGLOBIN: 15.2 g/dL (ref 13.0–17.0)
LYMPHS PCT: 35.4 % (ref 12.0–46.0)
Lymphs Abs: 2.1 10*3/uL (ref 0.7–4.0)
MCHC: 34.6 g/dL (ref 30.0–36.0)
MCV: 85.9 fl (ref 78.0–100.0)
MONOS PCT: 7.1 % (ref 3.0–12.0)
Monocytes Absolute: 0.4 10*3/uL (ref 0.1–1.0)
Neutro Abs: 3.2 10*3/uL (ref 1.4–7.7)
Neutrophils Relative %: 54.4 % (ref 43.0–77.0)
Platelets: 273 10*3/uL (ref 150.0–400.0)
RBC: 5.13 Mil/uL (ref 4.22–5.81)
RDW: 12.5 % (ref 11.5–15.5)
WBC: 5.9 10*3/uL (ref 4.0–10.5)

## 2015-10-25 LAB — LIPID PANEL
CHOLESTEROL: 261 mg/dL — AB (ref 0–200)
HDL: 39.5 mg/dL (ref >39.00)
LDL CALC: 188 mg/dL — AB (ref 0–99)
NONHDL: 221.33
Total CHOL/HDL Ratio: 7
Triglycerides: 165 mg/dL — ABNORMAL HIGH (ref 0.0–149.0)
VLDL: 33 mg/dL (ref 0.0–40.0)

## 2015-10-25 LAB — TSH: TSH: 1.05 u[IU]/mL (ref 0.35–4.50)

## 2015-10-25 LAB — POCT URINALYSIS DIPSTICK
Bilirubin, UA: NEGATIVE
Blood, UA: NEGATIVE
Glucose, UA: NEGATIVE
KETONES UA: NEGATIVE
Leukocytes, UA: NEGATIVE
Nitrite, UA: NEGATIVE
PH UA: 6.5
SPEC GRAV UA: 1.02
UROBILINOGEN UA: 0.2

## 2015-10-25 LAB — BASIC METABOLIC PANEL
BUN: 15 mg/dL (ref 6–23)
CO2: 27 mEq/L (ref 19–32)
Calcium: 9.7 mg/dL (ref 8.4–10.5)
Chloride: 101 mEq/L (ref 96–112)
Creatinine, Ser: 0.92 mg/dL (ref 0.40–1.50)
GFR: 98.28 mL/min (ref >60.00)
Glucose, Bld: 99 mg/dL (ref 70–99)
POTASSIUM: 4.3 meq/L (ref 3.5–5.1)
Sodium: 139 mEq/L (ref 135–145)

## 2015-10-27 ENCOUNTER — Ambulatory Visit (INDEPENDENT_AMBULATORY_CARE_PROVIDER_SITE_OTHER): Payer: BLUE CROSS/BLUE SHIELD | Admitting: Family Medicine

## 2015-10-27 ENCOUNTER — Encounter: Payer: Self-pay | Admitting: Family Medicine

## 2015-10-27 VITALS — BP 108/78 | HR 94 | Temp 98.9°F | Ht 66.5 in | Wt 174.0 lb

## 2015-10-27 DIAGNOSIS — R945 Abnormal results of liver function studies: Secondary | ICD-10-CM

## 2015-10-27 DIAGNOSIS — Z0000 Encounter for general adult medical examination without abnormal findings: Secondary | ICD-10-CM

## 2015-10-27 DIAGNOSIS — E785 Hyperlipidemia, unspecified: Secondary | ICD-10-CM

## 2015-10-27 DIAGNOSIS — R7989 Other specified abnormal findings of blood chemistry: Secondary | ICD-10-CM

## 2015-10-27 NOTE — Progress Notes (Signed)
Tana ConchStephen Hunter, MD Phone: [phone]  Subjective:  Patient presents today for their annual physical. Chief complaint-noted.   -See problem oriented charting ROS- full  review of systems was completed and negative except for: does not grow a full ebard and thinks he looks younger than he is and occasional constipation and allergies- runny nose, watery eyes  The following were reviewed and entered/updated in epic: Past Medical History  Diagnosis Date  . GERD (gastroesophageal reflux disease)     improving with exercise  . Hematochezia 1999    fissure   Patient Active Problem List   Diagnosis Date Noted  . Hyperlipidemia 10/27/2015    Priority: Medium  . Abnormal liver function tests 11/05/2014    Priority: Medium  . Dizziness and giddiness 11/05/2014    Priority: Low  . Allergic rhinitis 11/02/2011    Priority: Low  . CONSTIPATION, MILD 01/07/2009    Priority: Low   Past Surgical History  Procedure Laterality Date  . None      Family History  Problem Relation Age of Onset  . Hypertension Mother   . Thyroid disease Mother   . Cancer Mother     GYN  . Hypertension Father   . Cancer Cousin     thyroid  . Liver disease Neg Hx     Medications- reviewed and updated Current Outpatient Prescriptions  Medication Sig Dispense Refill  . cetirizine (ZYRTEC) 10 MG tablet Take 10 mg by mouth daily.    . fluticasone (FLONASE) 50 MCG/ACT nasal spray Place 1 spray into both nostrils daily.     No current facility-administered medications for this visit.    Allergies-reviewed and updated Allergies  Allergen Reactions  . Tetracyclines & Related Rash    Social History   Social History  . Marital Status: Single    Spouse Name: n/a  . Number of Children: 0  . Years of Education: College   Occupational History  . GIS     Mapping   Social History Main Topics  . Smoking status: Never Smoker   . Smokeless tobacco: Never Used  . Alcohol Use: 0.0 oz/week    0  Standard drinks or equivalent per week     Comment: on weekends only, once a month prior- now very very rare  . Drug Use: No  . Sexual Activity: Not Asked   Other Topics Concern  . None   Social History Narrative   Single. Not dating. Lives with brother and 2 dogs.       Works as Art gallery managerengineer- for [employer]      Hobbies: relax, watch tv    ROS--See HPI   Objective: BP 108/78 mmHg  Pulse 94  Temp(Src) 98.9 F (37.2 C) (Oral)  Ht 5' 6.5" (1.689 m)  Wt 174 lb (78.926 kg)  BMI 27.67 kg/m2 Gen: NAD, resting comfortably HEENT: Mucous membranes are moist. Oropharynx normal Neck: no thyromegaly CV: RRR no murmurs rubs or gallops Lungs: CTAB no crackles, wheeze, rhonchi Abdomen: soft/nontender/nondistended/normal bowel sounds. No rebound or guarding. No hepatomegaly Ext: no edema Skin: warm, dry Neuro: grossly normal, moves all extremities, PERRLA   Assessment/Plan:  39 y.o. male presenting for annual physical.  Health Maintenance counseling: 1. Anticipatory guidance: Patient counseled regarding regular dental exams, eye exams, wearing seatbelts, sunscreen 2. Risk factor reduction:  Advised patient of need for regular exercise and diet rich and fruits and vegetables to reduce risk of heart attack and stroke. Target weight of 155-160 within a year 3. Immunizations/screenings/ancillary studies-  flu shot Health Maintenance Due  Topic Date Due  . HIV Screening - declines this year 08/12/1993  4. Prostate cancer screening- no family history, start 50 5. Colon cancer screening - no family history, start 50 6. Skin cancer screening- no concerns per patient- declines full skin exam 7. Declines STD testing 8. Testicular self exams encouraged  Abnormal liver function tests S: extensive eval by Dr. Artist Pais in past. Thought to be fatty liver but did have high ferritin and inconclusive Hep B testing. Has lost 2 lbs by starting Elliptical for 2 months every other day A/P: patient is going  to aim for weight goal 160 within 6 months. At that point , repeat LFTs If still elevated: Hep B testing, ferritin, hemochromatosis genetic testing   Hyperlipidemia Cholesterol high with LDL just below 190- no rx until 40 at earliest and then hesitant with LFTs    Return precautions advised.   6 months repeat, visit with me in 1 year unless labs alert [place] to see each other sooner Orders Placed This Encounter  Procedures  . Hepatic Function Panel    Standing Status: Future     Number of Occurrences:      Standing Expiration Date: 10/26/2016

## 2015-10-27 NOTE — Patient Instructions (Addendum)
Have pharmacy fax over form when you receive your flu shot [phone].  Cholesterol and liver function tests high- both would likely improve with weight loss.   We set a goal of 160 within 6 months- the weight you were 2 years ago Wt Readings from Last 3 Encounters:  10/27/15 174 lb (78.926 kg)  11/05/14 176 lb (79.833 kg)  12/14/13 160 lb (72.576 kg)  some tips below. Thrilled you started exercising!   Return for liver function tests in 6 months- can schedule before you leave       My 5 to Fitness!  5: fruits and vegetables per day (work on 9 per day if you are at 5) 4: exercise 5 times per week for at least 30 minutes (walking counts!) 3: meals per day (don't skip breakfast!) 0-1: sweet per day (2 cookies, 1 small cup of ice cream) 0- sugar sweetened beverages (water only)  These are general tips for healthy living. Try to start with 1 or 2 habit TODAY and make it a part of your life for several months

## 2015-10-27 NOTE — Assessment & Plan Note (Signed)
S: extensive eval by Dr. Artist PaisYoo in past. Thought to be fatty liver but did have high ferritin and inconclusive Hep B testing. Has lost 2 lbs by starting Elliptical for 2 months every other day A/P: patient is going to aim for weight goal 160 within 6 months. At that point , repeat LFTs If still elevated: Hep B testing, ferritin, hemochromatosis genetic testing

## 2015-10-27 NOTE — Progress Notes (Signed)
Pre visit review using our clinic review tool, if applicable. No additional management support is needed unless otherwise documented below in the visit note. 

## 2015-10-27 NOTE — Assessment & Plan Note (Signed)
Cholesterol high with LDL just below 190- no rx until 42 at earliest and then hesitant with LFTs

## 2016-01-03 ENCOUNTER — Encounter: Payer: Self-pay | Admitting: *Deleted

## 2016-04-20 ENCOUNTER — Other Ambulatory Visit (INDEPENDENT_AMBULATORY_CARE_PROVIDER_SITE_OTHER): Payer: BLUE CROSS/BLUE SHIELD

## 2016-04-20 DIAGNOSIS — R7989 Other specified abnormal findings of blood chemistry: Secondary | ICD-10-CM | POA: Diagnosis not present

## 2016-04-20 DIAGNOSIS — R945 Abnormal results of liver function studies: Secondary | ICD-10-CM

## 2016-04-20 LAB — HEPATIC FUNCTION PANEL
ALT: 85 U/L — AB (ref 0–53)
AST: 35 U/L (ref 0–37)
Albumin: 4.6 g/dL (ref 3.5–5.2)
Alkaline Phosphatase: 49 U/L (ref 39–117)
BILIRUBIN DIRECT: 0.1 mg/dL (ref 0.0–0.3)
BILIRUBIN TOTAL: 0.7 mg/dL (ref 0.2–1.2)
Total Protein: 7.5 g/dL (ref 6.0–8.3)

## 2016-04-23 ENCOUNTER — Other Ambulatory Visit: Payer: BLUE CROSS/BLUE SHIELD

## 2017-06-24 ENCOUNTER — Ambulatory Visit (INDEPENDENT_AMBULATORY_CARE_PROVIDER_SITE_OTHER)
Admission: RE | Admit: 2017-06-24 | Discharge: 2017-06-24 | Disposition: A | Discharge status: Home / Self Care | Payer: BLUE CROSS/BLUE SHIELD | Source: Ambulatory Visit | Attending: Family Medicine | Admitting: Family Medicine

## 2017-06-24 ENCOUNTER — Ambulatory Visit (INDEPENDENT_AMBULATORY_CARE_PROVIDER_SITE_OTHER): Payer: BLUE CROSS/BLUE SHIELD | Admitting: Family Medicine

## 2017-06-24 ENCOUNTER — Encounter: Payer: Self-pay | Admitting: Family Medicine

## 2017-06-24 VITALS — BP 126/88 | HR 115 | Temp 98.4°F | Ht 66.5 in | Wt 173.8 lb

## 2017-06-24 DIAGNOSIS — M25531 Pain in right wrist: Secondary | ICD-10-CM | POA: Diagnosis not present

## 2017-06-24 MED ORDER — DICLOFENAC SODIUM 75 MG PO TBEC
75.0000 mg | DELAYED_RELEASE_TABLET | Freq: Two times a day (BID) | ORAL | 0 refills | Status: DC
Start: 2017-06-24 — End: 2017-07-25

## 2017-06-24 NOTE — Patient Instructions (Signed)
Suspect wrist sprain but want to rule out fracture with x-ray. Keep up your wrist brace. Use antiinflammatory for 10 days (voltaren). If you are not continuing to slowly get better over these 10 days reach out to [place] by mychart. May return to work next week if feel able- if not let ustry to get you into sports medicine or orthopedics  Wrist Sprain, Adult A wrist sprain is a stretch or tear in the strong, fibrous tissues (ligaments) that connect your wrist bones. There are three types of wrist sprains:  Grade 1. In this type of sprain, the ligament is stretched more than normal.  Grade 2. In this type of sprain, the ligament is partially torn. You may be able to move your wrist, but not very much.  Grade 3. In this type of sprain, the ligament or muscle is completely torn. You may find it difficult or extremely painful to move your wrist even a little.  What are the causes? A wrist sprain can be caused by using the wrist too much during sports, exercise, or at work. It can also happen with a fall or during an accident. What increases the risk? This condition is more likely to occur in people:  With a previous wrist or arm injury.  With poor wrist strength and flexibility.  Who play contact sports, such as football or soccer.  Who play sports that may result in a fall, such as skateboarding, biking, skiing, or snowboarding.  Who do not exercise regularly.  Who use exercise equipment that does not fit well.  What are the signs or symptoms? Symptoms of this condition include:  Pain in the wrist, arm, or hand.  Swelling or bruised skin near the wrist, hand, or arm. The skin may look yellow or kind of blue.  Stiffness or trouble moving the hand.  Hearing a pop or feeling a tear at the time of the injury.  A warm feeling in the skin around the wrist.  How is this diagnosed? This condition is diagnosed with a physical exam. Sometimes an X-ray is taken to make sure a bone did not  break. If your health care provider thinks that you tore a ligament, he or she may order an MRI of your wrist. How is this treated? This condition is treated by resting and applying ice to your wrist. Additional treatment may include:  Medicine for pain and inflammation.  A splint to keep your wrist still (immobilized).  Exercises to strengthen and stretch your wrist.  Surgery. This may be done if the ligament is completely torn.  Follow these instructions at home: If you have a splint:   Do not put pressure on any part of the splint until it is fully hardened. This may take several hours.  Wear the splint as told by your health care provider. Remove it only as told by your health care provider.  Loosen the splint if your fingers tingle, become numb, or turn cold and blue.  If your splint is not waterproof: ? Do not let it get wet. ? Cover it with a watertight covering when you take a bath or a shower.  Keep the splint clean. Managing pain, stiffness, and swelling   If directed, put ice on the injured area. ? If you have a removable splint, remove it as told by your health care provider. ? Put ice in a plastic bag. ? Place a towel between your skin and the bag or between the splint and the bag. ?  Leave the ice on for 20 minutes, 2-3 times per day.  Move your fingers often to avoid stiffness and to lessen swelling.  Raise (elevate) the injured area above the level of your heart while you are sitting or lying down. Activity  Rest your wrist. Do not do things that cause pain.  Return to your normal activities as told by your health care provider. Ask your health care provider what activities are safe for you.  Do exercises as told by your health care provider. General instructions  Take over-the-counter and prescription medicines only as told by your health care provider.  Do not use any products that contain nicotine or tobacco, such as cigarettes and e-cigarettes.  These can delay healing. If you need help quitting, ask your health care provider.  Ask your health care provider when it is safe to drive if you have a splint.  Keep all follow-up visits as told by your health care provider. This is important. Contact a health care provider if:  Your pain, bruising, or swelling gets worse.  Your skin becomes red, gets a rash, or has open sores.  Your pain does not get better or it gets worse. Get help right away if:  You have a new or sudden sharp pain in the hand, arm, or wrist.  You have tingling or numbness in your hand.  Your fingers turn white, very red, or cold and blue.  You cannot move your fingers. This information is not intended to replace advice given to you by your health care provider. Make sure you discuss any questions you have with your health care provider. Document Released: 08/06/2014 Document Revised: 06/30/2016 Document Reviewed: 06/21/2016 Elsevier Interactive Patient Education  2017 [employer]

## 2017-06-24 NOTE — Progress Notes (Signed)
Subjective:  Stephen Gomez is a 41 y.o. year old very pleasant male patient who presents for/with See problem oriented charting ROS- has wrist pain after fall. Some burising- no singificant redness. Had some warmth which has improved.    Past Medical History-  Patient Active Problem List   Diagnosis Date Noted  . Hyperlipidemia 10/27/2015    Priority: Medium  . Abnormal liver function tests 11/05/2014    Priority: Medium  . Dizziness and giddiness 11/05/2014    Priority: Low  . Allergic rhinitis 11/02/2011    Priority: Low  . CONSTIPATION, MILD 01/07/2009    Priority: Low    Medications- reviewed and updated Current Outpatient Prescriptions  Medication Sig Dispense Refill  . cetirizine (ZYRTEC) 10 MG tablet Take 10 mg by mouth daily.    . fluticasone (FLONASE) 50 MCG/ACT nasal spray Place 1 spray into both nostrils daily.    . diclofenac (VOLTAREN) 75 MG EC tablet Take 1 tablet (75 mg total) by mouth 2 (two) times daily. 20 tablet 0   No current facility-administered medications for this visit.     Objective: BP 126/88 (BP Location: Left Arm, Patient Position: Sitting, Cuff Size: Large)   Pulse (!) 115   Temp 98.4 F (36.9 C) (Oral)   Ht 5' 6.5" (1.689 m)   Wt 173 lb 12.8 oz (78.8 kg)   SpO2 96%   BMI 27.63 kg/m  Gen: NAD, resting comfortably Ext: no edema outside of the right wrist itself Skin: warm, dry, no rash over wrist MSK: limited ROM with assist to about 30 degrees before significant pain in flexion and extension of wrist. He avoids wrist motion otherwise. Also has most pain with ulnar flexion. There is some bruising noted and pain distal to the end of the ulna- no pain directly over ulna. No pain over radial side of wrist. Pain rated as mild to moderate at maximum.   Assessment/Plan:  Right wrist pain - Plan: DG Wrist Complete Right S: right wrist pain started after a fall on Friday. Slipped down back 2 steps that were wet and most of his weight came down on the  left arm/wrist. Had immediate pain- dull ache and worsened- hot and swollen at this point. Getting slightly better at this point. Has iced it every 4 hours along with ibuprofen once a day and twice a day biofreeze. Pain level 8-9/10 Saturday- was using brace. Using left arm to do tasks for him. Today with movement still 8-9/10. Cant move wrist without significant pain.   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  A/P: Improving pain to me points away from fracture but with his level of pain will get x-rays to rule out fracture. Continue brace from home. Continue biofreeze. Use 10 day course of nsaids as below. If no fracture noted but not ready to go back to work in 1 week- would refer to ortho or sports medicine.   Patient Instructions  Suspect wrist sprain but want to rule out fracture with x-ray. Keep up your wrist brace. Use antiinflammatory for 10 days (voltaren). If you are not continuing to slowly get better over these 10 days reach out to [place] by mychart. May return to work next week if feel able- if not let ustry to get you into sports medicine or orthopedics  Wrist Sprain, Adult A wrist sprain is a stretch or tear in the strong, fibrous tissues (ligaments) that connect your wrist bones. There are three types of wrist sprains:  Grade 1. In this type of sprain, the ligament is stretched more than normal.  Grade 2. In this type of sprain, the ligament is partially torn. You may be able to move your wrist, but not very much.  Grade 3. In this type of sprain, the ligament or muscle is completely torn. You may find it difficult or extremely painful to move your wrist even a little.  What are the causes? A wrist sprain can be caused by using the wrist too much during sports, exercise, or at work. It can also happen with a fall or during an accident. What increases the risk? This condition is more likely to occur in people:  With a previous wrist or arm injury.  With  poor wrist strength and flexibility.  Who play contact sports, such as football or soccer.  Who play sports that may result in a fall, such as skateboarding, biking, skiing, or snowboarding.  Who do not exercise regularly.  Who use exercise equipment that does not fit well.  What are the signs or symptoms? Symptoms of this condition include:  Pain in the wrist, arm, or hand.  Swelling or bruised skin near the wrist, hand, or arm. The skin may look yellow or kind of blue.  Stiffness or trouble moving the hand.  Hearing a pop or feeling a tear at the time of the injury.  A warm feeling in the skin around the wrist.  How is this diagnosed? This condition is diagnosed with a physical exam. Sometimes an X-ray is taken to make sure a bone did not break. If your health care provider thinks that you tore a ligament, he or she may order an MRI of your wrist. How is this treated? This condition is treated by resting and applying ice to your wrist. Additional treatment may include:  Medicine for pain and inflammation.  A splint to keep your wrist still (immobilized).  Exercises to strengthen and stretch your wrist.  Surgery. This may be done if the ligament is completely torn.  Follow these instructions at home: If you have a splint:   Do not put pressure on any part of the splint until it is fully hardened. This may take several hours.  Wear the splint as told by your health care provider. Remove it only as told by your health care provider.  Loosen the splint if your fingers tingle, become numb, or turn cold and blue.  If your splint is not waterproof: ? Do not let it get wet. ? Cover it with a watertight covering when you take a bath or a shower.  Keep the splint clean. Managing pain, stiffness, and swelling   If directed, put ice on the injured area. ? If you have a removable splint, remove it as told by your health care provider. ? Put ice in a plastic bag. ? Place  a towel between your skin and the bag or between the splint and the bag. ? Leave the ice on for 20 minutes, 2-3 times per day.  Move your fingers often to avoid stiffness and to lessen swelling.  Raise (elevate) the injured area above the level of your heart while you are sitting or lying down. Activity  Rest your wrist. Do not do things that cause pain.  Return to your normal activities as told by your health care provider. Ask your health care provider what activities are safe for you.  Do exercises as told by your health care provider. General instructions  Take  over-the-counter and prescription medicines only as told by your health care provider.  Do not use any products that contain nicotine or tobacco, such as cigarettes and e-cigarettes. These can delay healing. If you need help quitting, ask your health care provider.  Ask your health care provider when it is safe to drive if you have a splint.  Keep all follow-up visits as told by your health care provider. This is important. Contact a health care provider if:  Your pain, bruising, or swelling gets worse.  Your skin becomes red, gets a rash, or has open sores.  Your pain does not get better or it gets worse. Get help right away if:  You have a new or sudden sharp pain in the hand, arm, or wrist.  You have tingling or numbness in your hand.  Your fingers turn white, very red, or cold and blue.  You cannot move your fingers. This information is not intended to replace advice given to you by your health care provider. Make sure you discuss any questions you have with your health care provider. Document Released: 08/06/2014 Document Revised: 06/30/2016 Document Reviewed: 06/21/2016 Elsevier Interactive Patient Education  2017 [employer]    No problem-specific Assessment & Plan notes found for this encounter.  Orders Placed This Encounter  Procedures  . DG Wrist Complete Right    Standing Status:   Future     Standing Expiration Date:   08/25/2018    Order Specific Question:   Reason for Exam (SYMPTOM  OR DIAGNOSIS REQUIRED)    Answer:   right wrist pain after fall- ulnar side. rule out wrist fracture. suspected sprain.    Order Specific Question:   Preferred imaging location?    Answer:   [facility]    Order Specific Question:   Radiology Contrast Protocol - do NOT remove file path    Answer:   [email or web address]    Meds ordered this encounter  Medications  . diclofenac (VOLTAREN) 75 MG EC tablet    Sig: Take 1 tablet (75 mg total) by mouth 2 (two) times daily.    Dispense:  20 tablet    Refill:  0    Return precautions advised.  Tana Conch, MD

## 2017-06-25 ENCOUNTER — Encounter: Payer: Self-pay | Admitting: Sports Medicine

## 2017-06-25 ENCOUNTER — Other Ambulatory Visit: Payer: Self-pay

## 2017-06-25 ENCOUNTER — Ambulatory Visit (INDEPENDENT_AMBULATORY_CARE_PROVIDER_SITE_OTHER): Payer: BLUE CROSS/BLUE SHIELD | Admitting: Sports Medicine

## 2017-06-25 VITALS — BP 122/86 | HR 115 | Ht 66.5 in | Wt 174.6 lb

## 2017-06-25 DIAGNOSIS — S62111A Displaced fracture of triquetrum [cuneiform] bone, right wrist, initial encounter for closed fracture: Secondary | ICD-10-CM

## 2017-06-25 DIAGNOSIS — S62101A Fracture of unspecified carpal bone, right wrist, initial encounter for closed fracture: Secondary | ICD-10-CM

## 2017-06-25 NOTE — Progress Notes (Signed)
OFFICE VISIT NOTE Stephen Gomez. Stephen Gomez Sports Medicine [facility] at [facility] [phone]  Stephen Gomez - 41 y.o. male MRN [id number]  Date of birth: 04-Jun-1976  Visit Date: 06/25/2017  PCP: Stephen Majestic, MD   Referred by: Stephen Majestic, MD  Stephen Gomez acting as scribe for Stephen Gomez.  SUBJECTIVE:   Chief Complaint  Patient presents with  . right wrist pain   HPI: As below and per problem based documentation when appropriate.  Pt presents today with complaint of right wrist pain s/p fall 06/21/2017. He slipped on a wet stair and fell backwards with most of his weight impacted on his right side/hand/wrist.  He had xray of the wrist 06/24/2017 which showed the following:  IMPRESSION: Mildly displaced triquetrum fracture.  The pain is described as dull ache with increased warmth and swelling. Pain is rated as about 8/10 at onset of injury but he has kept it immobilized so he can't rate his current pain. He reports some sharp pains with movement. He did have some tingling in the wrist last night.   Worsened with use, he has difficulty working because he is right handed and uses a mouse at work.  Improves with immobilization. Ibuprofen helped a little. He has noticed some improvement in swelling.  Therapies tried include : He has tried icing the wrist, taking Ibuprofen, applying Biofreeze and wearing a brace. He has not used Biofreeze since his visit with Stephen Gomez yesterday. He did get some relief with Biofreeze when he was using it. He has not used ice on the wrist since yesterday.   Other associated symptoms include: He has noticed some recent stiffness in his fingers.   Pt denies fever, chills, night sweats.     Review of Systems  Constitutional: Negative for chills and fever.  Respiratory: Negative for shortness of breath and wheezing.   Cardiovascular: Negative for chest pain and palpitations.  Musculoskeletal: Positive for falls and  joint pain.  Neurological: Positive for tingling (fingers). Negative for dizziness and headaches.  Endo/Heme/Allergies: Does not bruise/bleed easily.    Otherwise per HPI.  HISTORY & PERTINENT PRIOR DATA:  No specialty comments available. He reports that he has never smoked. He has never used smokeless tobacco. No results for input(s): HGBA1C, LABURIC in the last 8760 hours. Medications & Allergies reviewed per EMR Patient Active Problem List   Diagnosis Date Noted  . Closed chip fracture of triquetral bone of right wrist 07/10/2017  . Hyperlipidemia 10/27/2015  . Abnormal liver function tests 11/05/2014  . Dizziness and giddiness 11/05/2014  . Allergic rhinitis 11/02/2011  . CONSTIPATION, MILD 01/07/2009   Past Medical History:  Diagnosis Date  . GERD (gastroesophageal reflux disease)    improving with exercise  . Hematochezia 1999   fissure   Family History  Problem Relation Age of Onset  . Hypertension Mother   . Thyroid disease Mother   . Cancer Mother        GYN  . Breast cancer Mother   . Hypertension Father   . Cancer Cousin        thyroid  . Liver disease Neg Hx    Past Surgical History:  Procedure Laterality Date  . none     Social History   Occupational History  . [employer]   Social History Main Topics  . Smoking status: Never Smoker  . Smokeless tobacco: Never Used  . Alcohol use 0.0 oz/week  Comment: on weekends only, once a month prior- now very very rare  . Drug use: No  . Sexual activity: Not on file    OBJECTIVE:  VS:  HT:5' 6.5" (168.9 cm)   WT:174 lb 9.6 oz (79.2 kg)  BMI:27.8    BP:122/86  HR:(!) 115bpm  TEMP: ( )  RESP:97 % EXAM: Findings:  WDWN, NAD, Non-toxic appearing Alert & appropriately interactive Not depressed or anxious appearing No increased work of breathing. Pupils are equal. EOM intact without nystagmus No clubbing or cyanosis of the extremities appreciated No significant rashes/lesions/ulcerations  overlying the examined area. Radial pulses 2+/4.  No significant generalized UE edema although the right hand and wrist are markedly swollen.  Capillary refill is less than 3 seconds Sensation intact to light touch in upper extremities.  Right wrist: Well aligned.  Large swelling.  Focal pain over the triquetrum.  He is able to fully flex and extend his fingers.  Pain with any type of wrist extension and wrist flexion, ulnar deviation or radial deviation.  No focal anatomic snuffbox tenderness.     X-rays show a mildly displaced triquetral fracture.  These were reviewed in person by myself as well as reviewed in detail with the patient. ASSESSMENT & PLAN:     ICD-10-CM   1. Closed chip fracture of triquetrum of right wrist, initial encounter S62.111A   ================================================================= Closed chip fracture of triquetral bone of right wrist Custom fabricated full wrist EXOS splint provided today.  We will plan to follow-up with him in 2 weeks and have him remain in the splint except for hygiene purposes for that duration.  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 =============================================================== Follow-up: Return in about 2 weeks (around 07/09/2017).  Gomez/ATC served as Neurosurgeonscribe during this visit. History, Physical, and Plan performed by medical provider. Documentation and orders reviewed and attested to.      Stephen BiddingMichael Rigby, DO    [facility]

## 2017-06-25 NOTE — Addendum Note (Signed)
Addended by: Shelva MajesticHUNTER, Verdelle Valtierra O on: 06/25/2017 07:12 AM   Modules accepted: Orders

## 2017-06-28 ENCOUNTER — Telehealth: Payer: Self-pay | Admitting: Family Medicine

## 2017-06-28 NOTE — Telephone Encounter (Signed)
Spoke with patient and advised him to contact his employer regarding short term disability or FMLA. I did tell him we would be happy to complete whatever paperwork needed but he would have to get the paperwork from his employer.

## 2017-06-28 NOTE — Telephone Encounter (Signed)
Called [phone] and left VM for pt to call the office.

## 2017-06-28 NOTE — Telephone Encounter (Signed)
Pt would like to have a call back to discuss this work status due to his arm that was injured.

## 2017-06-28 NOTE — Telephone Encounter (Signed)
Patient would like a call back RE disability concerns.  Thank you,  -LL

## 2017-06-28 NOTE — Telephone Encounter (Signed)
Spoke with patient and he advised that he doesn't feel like he can work due to his injury. He would like to be written out until his follow-up with Dr. Berline Choughigby 07/09/17.

## 2017-06-28 NOTE — Telephone Encounter (Signed)
Patient returning phone call. Patient stated he would call again Monday if Gearldine BienenstockBrandy does not call today.

## 2017-07-01 NOTE — Telephone Encounter (Signed)
Letter at the front desk for pt to pick up. Called pt and advised.

## 2017-07-09 ENCOUNTER — Ambulatory Visit (INDEPENDENT_AMBULATORY_CARE_PROVIDER_SITE_OTHER): Payer: BLUE CROSS/BLUE SHIELD | Admitting: Sports Medicine

## 2017-07-09 ENCOUNTER — Ambulatory Visit (INDEPENDENT_AMBULATORY_CARE_PROVIDER_SITE_OTHER): Payer: BLUE CROSS/BLUE SHIELD

## 2017-07-09 ENCOUNTER — Encounter: Payer: Self-pay | Admitting: Sports Medicine

## 2017-07-09 VITALS — BP 112/86 | HR 118 | Ht 66.5 in | Wt 168.2 lb

## 2017-07-09 DIAGNOSIS — M25531 Pain in right wrist: Secondary | ICD-10-CM | POA: Diagnosis not present

## 2017-07-09 DIAGNOSIS — S62111D Displaced fracture of triquetrum [cuneiform] bone, right wrist, subsequent encounter for fracture with routine healing: Secondary | ICD-10-CM

## 2017-07-09 NOTE — Progress Notes (Signed)
OFFICE VISIT NOTE Veverly FellsMichael D. Delorise Shinerigby, DO   Sports Medicine [facility] [phone]  Donnamarie RossettiJames E Kushnir - 41 y.o. male MRN [id number]  Date of birth: 12-Jul-1976  Visit Date: 07/09/2017  PCP: Shelva MajesticHunter, Stephen O, MD   Referred by: Shelva MajesticHunter, Stephen O, MD  Orlie DakinBrandy Shelton, CMA acting as scribe for Dr. Berline Choughigby.  SUBJECTIVE:   Chief Complaint  Patient presents with  . Follow-up    right wrist pain   HPI: As below and per problem based documentation when appropriate.  Pt presents today in follow-up of mildly displaced triquetrum fracture of the right wrist.  Pain started after a fall on 06/21/17. Pt had xray done 06/24/17. He was prescribed Diclofenac 75 mg to take 1 tablet BID by Dr. Durene CalHunter but was told that he didn't need to take it if it was a fracture so he has not been taking it. He was put in an Exos wrist brace but has some concerns that he may be wearing it too tight. He has noticed some peeling on his fingers. He will occasionally have numbness and tingling along with cold fingers. He says that he keeps his arm raised in the air because when he lets it hang by his side he feels the blood rushing into his hand and fingers and feels like it causes some swelling and the brace becomes very tight. He doesn't have much pain in the wrist but has noticed more of a generalized stiffness. He has occasional "twinge" of pain. He doesn't have much discomfort when moving his fingers he just cannot move them very much. He has some discomfort at the base of his thumb where the brace meet the thumb. He says that it just feels kind of raw. He did have an episode of muscle spasms this past Friday.   Pt denies fever, chills, night sweats. He did have occasional hot flashes the week after his visit.     Review of Systems  Constitutional: Negative for chills and fever.  Respiratory: Negative for shortness of breath and wheezing.   Cardiovascular: Negative for chest pain and palpitations.    Musculoskeletal: Negative for falls.  Neurological: Positive for tingling (fingers). Negative for dizziness and headaches.  Endo/Heme/Allergies: Does not bruise/bleed easily.    Otherwise per HPI.  HISTORY & PERTINENT PRIOR DATA:  No specialty comments available. He reports that he has never smoked. He has never used smokeless tobacco. No results for input(s): HGBA1C, LABURIC in the last 8760 hours. Medications & Allergies reviewed per EMR Patient Active Problem List   Diagnosis Date Noted  . Closed chip fracture of triquetral bone of right wrist 07/10/2017  . Hyperlipidemia 10/27/2015  . Abnormal liver function tests 11/05/2014  . Dizziness and giddiness 11/05/2014  . Allergic rhinitis 11/02/2011  . CONSTIPATION, MILD 01/07/2009   Past Medical History:  Diagnosis Date  . GERD (gastroesophageal reflux disease)    improving with exercise  . Hematochezia 1999   fissure   Family History  Problem Relation Age of Onset  . Hypertension Mother   . Thyroid disease Mother   . Cancer Mother        GYN  . Hypertension Father   . Cancer Cousin        thyroid  . Liver disease Neg Hx    Past Surgical History:  Procedure Laterality Date  . none     Social History   Occupational History  . [employer]   Social  History Main Topics  . Smoking status: Never Smoker  . Smokeless tobacco: Never Used  . Alcohol use 0.0 oz/week     Comment: on weekends only, once a month prior- now very very rare  . Drug use: No  . Sexual activity: Not on file    OBJECTIVE:  VS:  HT:5' 6.5" (168.9 cm)   WT:168 lb 3.2 oz (76.3 kg)  BMI:26.8    BP:112/86  HR:(!) 118bpm  TEMP: ( )  RESP:98 % EXAM: Findings:  Right wrist is overall well aligned.  No significant swelling.  Only mild focal tenderness over the dorsal aspect of the wrist of the triquetrum.  He has generalized stiffness with flexion extension of his finger secondary to immobility and disuse.  He is good capillary refill.  No  dystrophic signs. X-rays reviewed today show interval healing already.     Dg Wrist Complete Right  Result Date: 07/09/2017 CLINICAL DATA:  History of wrist fracture, followup EXAM: RIGHT WRIST - COMPLETE 3+ VIEW COMPARISON:  Right wrist films of 06/24/2017 FINDINGS: The triquetrum fracture line is less well visualized possibly indicating some interval healing. No further displacement of the fracture fragment is seen on the lateral view dorsally. The radiocarpal joint space appears normal. Carpal bone alignment is normal. IMPRESSION: Little change to perhaps slight interval healing of triquetrum fracture. Electronically Signed   By: Dwyane Dee M.D.   On: 07/09/2017 10:20   Dg Wrist Complete Right  Result Date: 06/24/2017 CLINICAL DATA:  Right wrist pain after fall 3 days ago. EXAM: RIGHT WRIST - COMPLETE 3+ VIEW COMPARISON:  None. FINDINGS: Mildly displaced triquetrum fracture is noted on lateral projection. No other bony abnormality is noted. Joint spaces are intact. No soft tissue abnormality is noted. IMPRESSION: Mildly displaced triquetrum fracture. Electronically Signed   By: Lupita Raider, M.D.   On: 06/24/2017 16:35   ASSESSMENT & PLAN:     ICD-10-CM   1. Right wrist pain M25.531 DG Wrist Complete Right  2. Closed chip fracture of right triquetrum with routine healing, subsequent encounter S62.111D   ================================================================= Closed chip fracture of triquetral bone of right wrist This is healing well already.  X-rays are reassuring.  Continue with immobilization 24 hours a day but okay to come out of for showering and personal care. He does need to begin using this hand while mobilized to increased risk of stiffness and this was emphasized with him and his family is here with him today.  Follow-up in 2 weeks for repeat x-rays at time was consistent with total immobilization.  There are no Patient Instructions on file for this  visit.================================================================= Follow-up: Return in about 2 weeks (around 07/23/2017).   CMA/ATC served as Neurosurgeon during this visit. History, Physical, and Plan performed by medical provider. Documentation and orders reviewed and attested to.      Gaspar Bidding, DO    [name] Phys[facility]

## 2017-07-10 DIAGNOSIS — S62111A Displaced fracture of triquetrum [cuneiform] bone, right wrist, initial encounter for closed fracture: Secondary | ICD-10-CM | POA: Insufficient documentation

## 2017-07-10 NOTE — Assessment & Plan Note (Signed)
This is healing well already.  X-rays are reassuring.  Continue with immobilization 24 hours a day but okay to come out of for showering and personal care. He does need to begin using this hand while mobilized to increased risk of stiffness and this was emphasized with him and his family is here with him today.  Follow-up in 2 weeks for repeat x-rays at time was consistent with total immobilization.

## 2017-07-23 ENCOUNTER — Telehealth: Payer: Self-pay | Admitting: Sports Medicine

## 2017-07-23 ENCOUNTER — Ambulatory Visit (INDEPENDENT_AMBULATORY_CARE_PROVIDER_SITE_OTHER): Payer: BLUE CROSS/BLUE SHIELD | Admitting: Sports Medicine

## 2017-07-23 ENCOUNTER — Ambulatory Visit (INDEPENDENT_AMBULATORY_CARE_PROVIDER_SITE_OTHER): Payer: BLUE CROSS/BLUE SHIELD

## 2017-07-23 ENCOUNTER — Encounter: Payer: Self-pay | Admitting: Sports Medicine

## 2017-07-23 VITALS — BP 112/86 | HR 88 | Ht 66.5 in | Wt 171.0 lb

## 2017-07-23 DIAGNOSIS — S62111D Displaced fracture of triquetrum [cuneiform] bone, right wrist, subsequent encounter for fracture with routine healing: Secondary | ICD-10-CM

## 2017-07-23 DIAGNOSIS — T148XXA Other injury of unspecified body region, initial encounter: Secondary | ICD-10-CM

## 2017-07-23 NOTE — Assessment & Plan Note (Signed)
Healing as expected, slightly denuded skin, cast care/EXOS care reviewed in detail.  I like for him to come out of this several times per day and work on gentle range of motion of the wrist as well as with grip strength.  Avoid any exacerbating activities.  Follow-up in 2 weeks for clinical recheck, no x-rays needed at that time unless significant worsening of symptoms.

## 2017-07-23 NOTE — Telephone Encounter (Signed)
Patient called to make sure that it is ok that he uses his car to "bake" his exo cast rather than using a hair dryer. Available on his mobile # listed

## 2017-07-23 NOTE — Progress Notes (Signed)
OFFICE VISIT NOTE Stephen Gomez. Stephen Gomez Sports Medicine [facility] at [facility] [phone]  Stephen Gomez - 41 y.o. male MRN [id number]  Date of birth: 11-Mar-1976  Visit Date: 07/23/2017  PCP: Shelva Majestic, MD   Referred by: Shelva Majestic, MD  Zollie Pee, CMA acting as scribe for Dr. Berline Chough.  SUBJECTIVE:   Chief Complaint  Patient presents with  . Follow-up    2wk    HPI: As below and per problem based documentation when appropriate.  Patient presents today for a two week follow up on closed chip fracture of right wrist  S/p fall 06/21/17 Patient experiences no pain at this time and states he feels he is improving     Review of Systems  Constitutional: Negative for chills, fever and malaise/fatigue.  Respiratory: Negative for shortness of breath and wheezing.   Cardiovascular: Negative for chest pain and palpitations.  Musculoskeletal: Negative for back pain, falls, joint pain and neck pain.  Neurological: Negative for dizziness and headaches.  Endo/Heme/Allergies: Does not bruise/bleed easily.    Otherwise per HPI.  HISTORY & PERTINENT PRIOR DATA:  No specialty comments available. He reports that he has never smoked. He has never used smokeless tobacco. No results for input(s): HGBA1C, LABURIC in the last 8760 hours. Medications & Allergies reviewed per EMR Patient Active Problem List   Diagnosis Date Noted  . Closed chip fracture of triquetral bone of right wrist 07/10/2017  . Hyperlipidemia 10/27/2015  . Abnormal liver function tests 11/05/2014  . Dizziness and giddiness 11/05/2014  . Allergic rhinitis 11/02/2011  . CONSTIPATION, MILD 01/07/2009   Past Medical History:  Diagnosis Date  . GERD (gastroesophageal reflux disease)    improving with exercise  . Hematochezia 1999   fissure   Family History  Problem Relation Age of Onset  . Hypertension Mother   . Thyroid disease Mother   . Cancer Mother        GYN  .  Hypertension Father   . Cancer Cousin        thyroid  . Liver disease Neg Hx    Past Surgical History:  Procedure Laterality Date  . none     Social History   Occupational History  . [employer]   Social History Main Topics  . Smoking status: Never Smoker  . Smokeless tobacco: Never Used  . Alcohol use 0.0 oz/week     Comment: on weekends only, once a month prior- now very very rare  . Drug use: No  . Sexual activity: Not on file    OBJECTIVE:  VS:  HT:5' 6.5" (168.9 cm)   WT:171 lb (77.6 kg)  BMI:27.2    BP:112/86  HR:88bpm  TEMP: ( )  RESP:97 % EXAM: Findings:  Right Wrist: Wrist has slightly denuded skin with a vertical superficial excoriation along the medial aspect of the wrist along the distal third of the forearm.  There is no surrounding erythema.  The wrist has only a small amount of pain over the triquetrum/lateral dorsal wrist with no pain over the DRUJ or anatomic snuffbox.  He has persistent stiffness with wrist extension and wrist flexion and ulnar deviation and radial deviation but finger flexion extension strength is 5+ out of 5 with full range of motion.  Pulses are 2+/4 with good capillary refill.     Dg Wrist Complete Right  Result Date: 07/23/2017 CLINICAL DATA:  History of triquetrum fracture EXAM: RIGHT WRIST -  COMPLETE 3+ VIEW COMPARISON:  07/09/2017 FINDINGS: Triquetrum fracture is again identified best seen on the lateral projection. No significant displacement of the fracture fragment is noted. No new fracture is seen. Mild soft tissue swelling is noted consistent with the recent injury. IMPRESSION: Soft tissue swelling consistent with the recent injury. Stable appearing triquetrum fracture Electronically Signed   By: Alcide CleverMark  Lukens M.D.   On: 07/23/2017 11:22   Dg Wrist Complete Right  Result Date: 07/09/2017 CLINICAL DATA:  History of wrist fracture, followup EXAM: RIGHT WRIST - COMPLETE 3+ VIEW COMPARISON:  Right wrist films of 06/24/2017  FINDINGS: The triquetrum fracture line is less well visualized possibly indicating some interval healing. No further displacement of the fracture fragment is seen on the lateral view dorsally. The radiocarpal joint space appears normal. Carpal bone alignment is normal. IMPRESSION: Little change to perhaps slight interval healing of triquetrum fracture. Electronically Signed   By: Dwyane DeePaul  Barry M.D.   On: 07/09/2017 10:20   Dg Wrist Complete Right  Result Date: 06/24/2017 CLINICAL DATA:  Right wrist pain after fall 3 days ago. EXAM: RIGHT WRIST - COMPLETE 3+ VIEW COMPARISON:  None. FINDINGS: Mildly displaced triquetrum fracture is noted on lateral projection. No other bony abnormality is noted. Joint spaces are intact. No soft tissue abnormality is noted. IMPRESSION: Mildly displaced triquetrum fracture. Electronically Signed   By: Lupita RaiderJames  Green Jr, M.D.   On: 06/24/2017 16:35   ASSESSMENT & PLAN:     ICD-10-CM   1. Fracture T14.8XXA DG Wrist Complete Right  2. Closed chip fracture of right triquetrum with routine healing, subsequent encounter S62.111D   ================================================================= Closed chip fracture of triquetral bone of right wrist Healing as expected, slightly denuded skin, cast care/EXOS care reviewed in detail.  I like for him to come out of this several times per day and work on gentle range of motion of the wrist as well as with grip strength.  Avoid any exacerbating activities.  Follow-up in 2 weeks for clinical recheck, no x-rays needed at that time unless significant worsening of symptoms. =================================================================  Follow-up: Return in about 2 weeks (around 08/06/2017).   CMA/ATC served as Neurosurgeonscribe during this visit. History, Physical, and Plan performed by medical provider. Documentation and orders reviewed and attested to.      Gaspar BiddingMichael Rigby, DO    [facility]

## 2017-07-23 NOTE — Telephone Encounter (Signed)
Noted  

## 2017-07-23 NOTE — Telephone Encounter (Signed)
Patient does not need a phone call, "he figured it out." No further action required.

## 2017-07-25 ENCOUNTER — Ambulatory Visit (INDEPENDENT_AMBULATORY_CARE_PROVIDER_SITE_OTHER): Payer: BLUE CROSS/BLUE SHIELD | Admitting: Family Medicine

## 2017-07-25 ENCOUNTER — Encounter: Payer: Self-pay | Admitting: Family Medicine

## 2017-07-25 VITALS — BP 108/78 | HR 69 | Temp 98.3°F | Ht 66.5 in | Wt 169.4 lb

## 2017-07-25 DIAGNOSIS — Z0000 Encounter for general adult medical examination without abnormal findings: Secondary | ICD-10-CM

## 2017-07-25 DIAGNOSIS — R7989 Other specified abnormal findings of blood chemistry: Secondary | ICD-10-CM

## 2017-07-25 DIAGNOSIS — R945 Abnormal results of liver function studies: Secondary | ICD-10-CM

## 2017-07-25 DIAGNOSIS — E785 Hyperlipidemia, unspecified: Secondary | ICD-10-CM | POA: Diagnosis not present

## 2017-07-25 NOTE — Assessment & Plan Note (Signed)
Update LFTs- Encouraged need for healthy eating, regular exercise, weight loss.

## 2017-07-25 NOTE — Progress Notes (Signed)
Phone: [phone]  Subjective:  Patient presents today for their annual physical. Chief complaint-noted.   See problem oriented charting- ROS- full  review of systems was completed and negative except for: wearing cast on arm after triquetral bone fracture. No chest pain or shortness of breath. No headache or blurry vision.   The following were reviewed and entered/updated in epic: Past Medical History:  Diagnosis Date  . GERD (gastroesophageal reflux disease)    improving with exercise  . Hematochezia 1999   fissure   Patient Active Problem List   Diagnosis Date Noted  . Hyperlipidemia 10/27/2015    Priority: Medium  . Abnormal liver function tests 11/05/2014    Priority: Medium  . Dizziness and giddiness 11/05/2014    Priority: Low  . Allergic rhinitis 11/02/2011    Priority: Low  . CONSTIPATION, MILD 01/07/2009    Priority: Low  . Closed chip fracture of triquetral bone of right wrist 07/10/2017   Past Surgical History:  Procedure Laterality Date  . none      Family History  Problem Relation Age of Onset  . Hypertension Mother   . Thyroid disease Mother   . Cancer Mother        GYN  . Breast cancer Mother   . Hypertension Father   . Cancer Cousin        thyroid  . Liver disease Neg Hx     Medications- reviewed and updated Current Outpatient Prescriptions  Medication Sig Dispense Refill  . cetirizine (ZYRTEC) 10 MG tablet Take 10 mg by mouth daily.    . fluticasone (FLONASE) 50 MCG/ACT nasal spray Place 1 spray into both nostrils daily.     No current facility-administered medications for this visit.     Allergies-reviewed and updated Allergies  Allergen Reactions  . Tetracyclines & Related Rash    Social History   Social History  . Marital status: Single    Spouse name: n/a  . Number of children: 0  . Years of education: College   Occupational History  . [employer]    Mapping   Social History Main Topics  . Smoking status: Never Smoker   . Smokeless tobacco: Never Used  . Alcohol use 0.0 oz/week     Comment: on weekends only, once a month prior- now very very rare  . Drug use: No  . Sexual activity: Not Asked   Other Topics Concern  . None   Social History Narrative   Single. Not dating. Lives with brother and 1 dog      Works as Art gallery managerengineer- for [employer]      Hobbies: relax, watch tv    Objective: BP 108/78 (BP Location: Left Arm, Patient Position: Sitting, Cuff Size: Large)   Pulse 69   Temp 98.3 F (36.8 C) (Oral)   Ht 5' 6.5" (1.689 m)   Wt 169 lb 6.4 oz (76.8 kg)   SpO2 97%   BMI 26.93 kg/m  Gen: NAD, resting comfortably HEENT: Mucous membranes are moist. Oropharynx normal Neck: no thyromegaly CV: RRR no murmurs rubs or gallops Lungs: CTAB no crackles, wheeze, rhonchi Abdomen: soft/nontender/nondistended/normal bowel sounds. No rebound or guarding.  Ext: no edema Skin: warm, dry Neuro: grossly normal, moves all extremities, PERRLA  Assessment/Plan:  41 y.o. male presenting for annual physical.  Health Maintenance counseling: 1. Anticipatory guidance: Patient counseled regarding regular dental exams q6 months, eye exams yearly, wearing seatbelts.  2. Risk factor reduction:  Advised patient of need for regular exercise and  diet rich and fruits and vegetables to reduce risk of heart attack and stroke. Exercise- had been doing better on exercise then down in last month due to fracture- recommend 150 minutes a week. Diet-has been doing more chicken over steak- doing some fish- less fast food recently but at home due to fracture and cooking more. Weight down about 5 lbs from last CPE in November 2016. Discussed would love to see him back in 150s as he was in 2010. He is targetting 160 Wt Readings from Last 3 Encounters:  07/25/17 169 lb 6.4 oz (76.8 kg)  07/23/17 171 lb (77.6 kg)  07/09/17 168 lb 3.2 oz (76.3 kg)  3. Immunizations/screenings/ancillary studies- advised flu shot in fall. HIV screening  declined .  Immunization History  Administered Date(s) Administered  . Influenza Split 09/19/2011  . Influenza Whole 10/26/2008  . Influenza,inj,Quad PF,36+ Mos 10/18/2014  . Influenza-Unspecified 12/18/2015  . Tdap 09/19/2011  4. Prostate cancer screening-  no family history, start around age 54  5. Colon cancer screening -  no family history, start around age 64 6. Skin cancer screening/prevention- advised regular sunscreen use. Denies worrisome or changing skin lesions 7. Testicular cancer screening- advised monthly self exams  8. STD screening- patient opts out as not active.   Status of chronic or acute concerns   Healing from recent fracture- see Dr. Maryln Gottron notes  Hyperlipidemia S: poorly controlled on last check on no statin. No myalgias.  Lab Results  Component Value Date   CHOL 261 (H) 10/25/2015   HDL 39.50 10/25/2015   LDLCALC 188 (H) 10/25/2015   LDLDIRECT 141.8 08/24/2014   TRIG 165.0 (H) 10/25/2015   CHOLHDL 7 10/25/2015   A/P: not at levels would start statin before age 3 (<190 LDL) but needs to work on weight loss- update lipids  Abnormal liver function tests Update LFTs- Encouraged need for healthy eating, regular exercise, weight loss.   has watched alcohol intake- not even on weekly basis  Return in about 1 year (around 07/25/2018) for physical.  Orders Placed This Encounter  Procedures  . CBC    [place]    Standing Status:   Future    Standing Expiration Date:   07/25/2018  . Comprehensive metabolic panel    [place]    Standing Status:   Future    Standing Expiration Date:   07/25/2018    Order Specific Question:   Has the patient fasted?    Answer:   No  . Lipid panel        Standing Status:   Future    Standing Expiration Date:   07/25/2018    Order Specific Question:   Has the patient fasted?    Answer:   No   Return precautions advised.  Tana Conch, MD

## 2017-07-25 NOTE — Patient Instructions (Signed)
Schedule a lab visit at the check out desk within 2 weeks. Return for future fasting labs meaning nothing but water after midnight please. Ok to take your medications with water.   Please message us when you get your fall flu shot

## 2017-07-25 NOTE — Assessment & Plan Note (Addendum)
S: poorly controlled on last check on no statin. No myalgias.  Lab Results  Component Value Date   CHOL 261 (H) 10/25/2015   HDL 39.50 10/25/2015   LDLCALC 188 (H) 10/25/2015   LDLDIRECT 141.8 08/24/2014   TRIG 165.0 (H) 10/25/2015   CHOLHDL 7 10/25/2015   A/P: not at levels would start statin before age 41 (<190 LDL) but needs to work on weight loss- update lipids

## 2017-07-27 NOTE — Assessment & Plan Note (Signed)
Custom fabricated full wrist EXOS splint provided today.  We will plan to follow-up with him in 2 weeks and have him remain in the splint except for hygiene purposes for that duration.  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

## 2017-08-06 ENCOUNTER — Ambulatory Visit (INDEPENDENT_AMBULATORY_CARE_PROVIDER_SITE_OTHER): Payer: BLUE CROSS/BLUE SHIELD | Admitting: Sports Medicine

## 2017-08-06 ENCOUNTER — Encounter: Payer: Self-pay | Admitting: Sports Medicine

## 2017-08-06 VITALS — BP 110/80 | HR 112 | Ht 66.5 in | Wt 170.4 lb

## 2017-08-06 DIAGNOSIS — S62111D Displaced fracture of triquetrum [cuneiform] bone, right wrist, subsequent encounter for fracture with routine healing: Secondary | ICD-10-CM | POA: Diagnosis not present

## 2017-08-06 NOTE — Progress Notes (Signed)
OFFICE VISIT NOTE Stephen Gomez. [facility] [phone]  Stephen Gomez - 41 y.o. male MRN [id number]  Date of birth: Dec 01, 1976  Visit Date: 08/06/2017  PCP: Stephen Majestic, MD   Referred by: Stephen Majestic, MD  Stephen Gomez, CMA acting as scribe for Dr. Berline Chough.  SUBJECTIVE:   Chief Complaint  Patient presents with  . Follow-up    fracture rt wrist   HPI: As below and per problem based documentation when appropriate.  Stephen Gomez is an established patient presenting today in follow-up of fracture of the rt wrist. He was last seen 07/23/17 and advised to come out of his EXOS brace a few times a day to work on ROM and grip strength.   Pt reports that he has been taking his brace off a couple of times daily and working on grip strength. He reports that's overall his wrist has been doing a lot better than when he was initially seen. He still notices some swelling in the wrist when he takes the brace off before doing any activity. He has also noticed that he cannot completely bend his pinky even when he uses the other hand to help push it down. He does still have some discomfort in the wrist when the brace is off and he uses the rt hand. He is able to tie his shoes and put his socks on.     Review of Systems  Constitutional: Negative for chills and fever.  Respiratory: Negative for shortness of breath and wheezing.   Cardiovascular: Negative for chest pain and palpitations.  Musculoskeletal: Positive for joint pain. Negative for falls.  Neurological: Negative for dizziness, tingling and headaches.  Endo/Heme/Allergies: Does not bruise/bleed easily.    Otherwise per HPI.  HISTORY & PERTINENT PRIOR DATA:  No specialty comments available. He reports that he has never smoked. He has never used smokeless tobacco. No results for input(s): HGBA1C, LABURIC in the last 8760 hours. Medications & Allergies reviewed per EMR Patient  Active Problem List   Diagnosis Date Noted  . Closed chip fracture of triquetral bone of right wrist 07/10/2017  . Hyperlipidemia 10/27/2015  . Abnormal liver function tests 11/05/2014  . Dizziness and giddiness 11/05/2014  . Allergic rhinitis 11/02/2011  . CONSTIPATION, MILD 01/07/2009   Past Medical History:  Diagnosis Date  . GERD (gastroesophageal reflux disease)    improving with exercise  . Hematochezia 1999   fissure   Family History  Problem Relation Age of Onset  . Hypertension Mother   . Thyroid disease Mother   . Cancer Mother        GYN  . Breast cancer Mother   . Hypertension Father   . Cancer Cousin        thyroid  . Liver disease Neg Hx    Past Surgical History:  Procedure Laterality Date  . none     Social History   Occupational History  . [employer]   Social History Main Topics  . Smoking status: Never Smoker  . Smokeless tobacco: Never Used  . Alcohol use 0.0 oz/week     Comment: on weekends only, once a month prior- now very very rare  . Drug use: No  . Sexual activity: Not on file    OBJECTIVE:  VS:  HT:5' 6.5" (168.9 cm)   WT:170 lb 6.4 oz (77.3 kg)  BMI:27.09    BP:110/80  HR:(!) 112bpm  TEMP: ( )  RESP:97 % EXAM: Findings:  Right upper extremity is well aligned.  He does have some swelling and small amount of residual bruising along the ulnar aspect more palmar sided than anything.  The dorsal aspect has improved skin with only a minimal amount of denuded skin and the linear excoriation is significantly improved.  Focally he has only a minimal amount of pain over the dorsal aspect of the wrist directly over the triquetrum but this is markedly improved.  His supination is limited however to 120 degrees.  His hand does fully close actively into a full grip but when independently flexing his pinky he is unable to fully touch the tip of the pinky to the MCP.  Radial pulses 2+/4.  Sensation is intact light touch.  He has normal  capillary refill.     Dg Wrist Complete Right  Result Date: 07/23/2017 CLINICAL DATA:  History of triquetrum fracture EXAM: RIGHT WRIST - COMPLETE 3+ VIEW COMPARISON:  07/09/2017 FINDINGS: Triquetrum fracture is again identified best seen on the lateral projection. No significant displacement of the fracture fragment is noted. No new fracture is seen. Mild soft tissue swelling is noted consistent with the recent injury. IMPRESSION: Soft tissue swelling consistent with the recent injury. Stable appearing triquetrum fracture Electronically Signed   By: Alcide Clever M.D.   On: 07/23/2017 11:22   Dg Wrist Complete Right  Result Date: 07/09/2017 CLINICAL DATA:  History of wrist fracture, followup EXAM: RIGHT WRIST - COMPLETE 3+ VIEW COMPARISON:  Right wrist films of 06/24/2017 FINDINGS: The triquetrum fracture line is less well visualized possibly indicating some interval healing. No further displacement of the fracture fragment is seen on the lateral view dorsally. The radiocarpal joint space appears normal. Carpal bone alignment is normal. IMPRESSION: Little change to perhaps slight interval healing of triquetrum fracture. Electronically Signed   By: Dwyane Dee M.D.   On: 07/09/2017 10:20   ASSESSMENT & PLAN:     ICD-10-CM   1. Closed chip fracture of right triquetrum with routine healing, subsequent encounter S62.111D   ================================================================= Closed chip fracture of triquetral bone of right wrist We are 5 weeks ago.  I like for him to discontinue the EXOS splint at this time completely, he has been out of it intermittently.  He continues to have a small amount of swelling in slightly denuded skin suspect he has been avoiding using this still.  His supination is limited due to stiffness in his forearm.  I would like for him to begin to try to use this as much as possible without exacerbating his symptoms but okay for him to take anti-inflammatories if needed.   Follow-up in 2 weeks and if any persistent symptoms can consider referral to hand therapy.  Body Helix compression sleeve to be used while active and located use a night splint at night to minimize nighttime exacerbations. ================================================================= Patient Instructions   You can use the compression sleeve at any time throughout the day but is most important to use while being active as well as for 2 hours post-activity.   It is appropriate to ice following activity with the compression sleeve in place.   =================================================================  Follow-up: Return in about 2 weeks (around 08/20/2017) for repeat clinical exam.   CMA/ATC served as scribe during this visit. History, Physical, and Plan performed by medical provider. Documentation and orders reviewed and attested to.      Gaspar Bidding, DO    [name] Phys[facility]

## 2017-08-06 NOTE — Assessment & Plan Note (Signed)
We are 5 weeks ago.  I like for him to discontinue the EXOS splint at this time completely, he has been out of it intermittently.  He continues to have a small amount of swelling in slightly denuded skin suspect he has been avoiding using this still.  His supination is limited due to stiffness in his forearm.  I would like for him to begin to try to use this as much as possible without exacerbating his symptoms but okay for him to take anti-inflammatories if needed.  Follow-up in 2 weeks and if any persistent symptoms can consider referral to hand therapy.  Body Helix compression sleeve to be used while active and located use a night splint at night to minimize nighttime exacerbations.

## 2017-08-06 NOTE — Patient Instructions (Signed)
You can use the compression sleeve at any time throughout the day but is most important to use while being active as well as for 2 hours post-activity.   It is appropriate to ice following activity with the compression sleeve in place.   

## 2017-08-09 ENCOUNTER — Other Ambulatory Visit (INDEPENDENT_AMBULATORY_CARE_PROVIDER_SITE_OTHER): Payer: BLUE CROSS/BLUE SHIELD

## 2017-08-09 DIAGNOSIS — R945 Abnormal results of liver function studies: Secondary | ICD-10-CM

## 2017-08-09 DIAGNOSIS — E785 Hyperlipidemia, unspecified: Secondary | ICD-10-CM

## 2017-08-09 DIAGNOSIS — R7989 Other specified abnormal findings of blood chemistry: Secondary | ICD-10-CM

## 2017-08-09 DIAGNOSIS — Z0000 Encounter for general adult medical examination without abnormal findings: Secondary | ICD-10-CM

## 2017-08-09 LAB — COMPREHENSIVE METABOLIC PANEL
ALBUMIN: 4.6 g/dL (ref 3.5–5.2)
ALT: 58 U/L — ABNORMAL HIGH (ref 0–53)
AST: 29 U/L (ref 0–37)
Alkaline Phosphatase: 48 U/L (ref 39–117)
BUN: 13 mg/dL (ref 6–23)
CALCIUM: 9.3 mg/dL (ref 8.4–10.5)
CHLORIDE: 99 meq/L (ref 96–112)
CO2: 27 mEq/L (ref 19–32)
CREATININE: 0.79 mg/dL (ref 0.40–1.50)
GFR: 116.06 mL/min (ref >60.00)
Glucose, Bld: 95 mg/dL (ref 70–99)
Potassium: 3.9 mEq/L (ref 3.5–5.1)
Sodium: 136 mEq/L (ref 135–145)
Total Bilirubin: 0.7 mg/dL (ref 0.2–1.2)
Total Protein: 7 g/dL (ref 6.0–8.3)

## 2017-08-09 LAB — CBC
HCT: 39.8 % (ref 39.0–52.0)
HEMOGLOBIN: 13.5 g/dL (ref 13.0–17.0)
MCHC: 33.8 g/dL (ref 30.0–36.0)
MCV: 87.1 fl (ref 78.0–100.0)
PLATELETS: 273 10*3/uL (ref 150.0–400.0)
RBC: 4.57 Mil/uL (ref 4.22–5.81)
RDW: 12.5 % (ref 11.5–15.5)
WBC: 6 10*3/uL (ref 4.0–10.5)

## 2017-08-09 LAB — LIPID PANEL
CHOLESTEROL: 231 mg/dL — AB (ref 0–200)
HDL: 31.3 mg/dL — ABNORMAL LOW (ref >39.00)
Total CHOL/HDL Ratio: 7

## 2017-08-09 LAB — LDL CHOLESTEROL, DIRECT: LDL DIRECT: 129 mg/dL

## 2017-08-20 ENCOUNTER — Ambulatory Visit (INDEPENDENT_AMBULATORY_CARE_PROVIDER_SITE_OTHER): Payer: BLUE CROSS/BLUE SHIELD | Admitting: Sports Medicine

## 2017-08-20 ENCOUNTER — Encounter: Payer: Self-pay | Admitting: Sports Medicine

## 2017-08-20 VITALS — BP 110/80 | HR 86 | Ht 66.5 in | Wt 171.2 lb

## 2017-08-20 DIAGNOSIS — S62111D Displaced fracture of triquetrum [cuneiform] bone, right wrist, subsequent encounter for fracture with routine healing: Secondary | ICD-10-CM

## 2017-08-20 NOTE — Assessment & Plan Note (Addendum)
No focal pain with palpation.  He has some residual stiffness and will benefit from referral to hand therapy.  External referral placed and information provided to the patient to call on his own.  We will plan to have him follow-up in 6 weeks if any persistent symptoms but otherwise we will plan to only have him come back as needed if he does well with and therapy.

## 2017-08-20 NOTE — Patient Instructions (Signed)
I have referred you to hand therapy to have them work on increasing your range of motion.  If you have any persistent problems I am happy to see you back at any point but would like to give this an additional 6 weeks.  Otherwise plan to come back to see me only if needed.

## 2017-08-20 NOTE — Progress Notes (Signed)
OFFICE VISIT NOTE Stephen FellsMichael D. Stephen Shinerigby, DO  [facility] [place] [phone]  Stephen RossettiJames E Gomez - 41 y.Gomez. male MRN [id number]  Date of birth: [birth year]  Visit Date: 08/20/2017  PCP: Stephen MajesticHunter, Stephen O, MD   Referred by: Stephen MajesticHunter, Stephen O, MD  Orlie DakinBrandy Gomez, CMA acting as scribe for Dr. Berline Choughigby.  SUBJECTIVE:   Chief Complaint  Patient presents with  . Follow-up    RT wrist fracture   HPI: As below and per problem based documentation when appropriate.  Stephen Gomez is an established patient presenting today in follow-up of RT wrist fracture. He was last seen 08/06/2017 and advised to d/c EXOS brace and start wearing Body Helix compression sleeve.   He has noticed improvement in pain since taking off the Exos brace. He is able to turn the car keys in the ignition. He has some pain while doing this but reports that it has improved. He has noticed continued swelling in the RT wrist. He has not pain when picking up light objects. He has been working on ROM exercises and reports increased ROM. He does reports occasional twinge of pain when using the elliptical. He has been wearing a wrist brace while on the elliptical and then the compression sleeve afterward. The pain comes and goes pretty quickly. He says that he feels that the Body Helix compression sleeve was too tight so he purchased another one. No trouble putting on socks and shoes. He does have some difficulty putting on the compression sleeve.     Review of Systems  Constitutional: Negative for chills and fever.  Respiratory: Negative for shortness of breath and wheezing.   Cardiovascular: Negative for chest pain and palpitations.  Musculoskeletal: Positive for joint pain. Negative for falls.  Neurological: Negative for dizziness, tingling and headaches.  Endo/Heme/Allergies: Does not bruise/bleed easily.    Otherwise per HPI.  HISTORY & PERTINENT PRIOR DATA:  No specialty comments available. He  reports that he has never smoked. He has never used smokeless tobacco. No results for input(s): HGBA1C, LABURIC in the last 8760 hours. Medications & Allergies reviewed per EMR Patient Active Problem List   Diagnosis Date Noted  . Closed chip fracture of triquetral bone of right wrist 07/10/2017  . Hyperlipidemia 10/27/2015  . Abnormal liver function tests 11/05/2014  . Dizziness and giddiness 11/05/2014  . Allergic rhinitis 11/02/2011  . CONSTIPATION, MILD 01/07/2009   Past Medical History:  Diagnosis Date  . GERD (gastroesophageal reflux disease)    improving with exercise  . Hematochezia 1999   fissure   Family History  Problem Relation Age of Onset  . Hypertension Mother   . Thyroid disease Mother   . Cancer Mother        GYN  . Breast cancer Mother   . Hypertension Father   . Cancer Cousin        thyroid  . Liver disease Neg Hx    Past Surgical History:  Procedure Laterality Date  . none     Social History   Occupational History  . [employer]    Mapping   Social History Main Topics  . Smoking status: Never Smoker  . Smokeless tobacco: Never Used  . Alcohol use 0.0 oz/week     Comment: on weekends only, once a month prior- now very very rare  . Drug use: No  . Sexual activity: Not on file    OBJECTIVE:  VS:  HT:5' 6.5" (168.9 cm)  WT:171 lb 3.2 oz (77.7 kg)  BMI:27.22    BP:110/80  HR:86bpm  TEMP: ( )  RESP:96 % EXAM: Findings:  Right wrist is overall normal-appearing.  He has no focal bony tenderness.  No significant effusion.  He does have some limited ulnar deviation and radial deviation as well as limited flexion and extension of the wrist but this is minimal, only limited by approximately 15-20 in both flexion and extension.  Grip strength is intact although slightly less than the left and pain-free. he continues to have a significant amount of guarding of the entire right upper extremity     Dg Wrist Complete Right  Result Date:  07/23/2017 CLINICAL DATA:  History of triquetrum fracture EXAM: RIGHT WRIST - COMPLETE 3+ VIEW COMPARISON:  07/09/2017 FINDINGS: Triquetrum fracture is again identified best seen on the lateral projection. No significant displacement of the fracture fragment is noted. No new fracture is seen. Mild soft tissue swelling is noted consistent with the recent injury. IMPRESSION: Soft tissue swelling consistent with the recent injury. Stable appearing triquetrum fracture Electronically Signed   By: Stephen Gomez M.D.   On: 07/23/2017 11:22   ASSESSMENT & PLAN:     ICD-10-CM   1. Closed chip fracture of right triquetrum with routine healing, subsequent encounter S62.111D Ambulatory referral to Occupational Therapy  ================================================================= Closed chip fracture of triquetral bone of right wrist No focal pain with palpation.  He has some residual stiffness and will benefit from referral to hand therapy.  External referral placed and information provided to the patient to call on his own.  We will plan to have him follow-up in 6 weeks if any persistent symptoms but otherwise we will plan to only have him come back as needed if he does well with and therapy. ================================================================= Patient Instructions  I have referred you to hand therapy to have them work on increasing your range of motion.  If you have any persistent problems I am happy to see you back at any point but would like to give this an additional 6 weeks.  Otherwise plan to come back to see me only if needed. ================================================================= No future appointments.  Follow-up: Return if symptoms worsen or fail to improve.   CMA/ATC served as Neurosurgeon during this visit. History, Physical, and Plan performed by medical provider. Documentation and orders reviewed and attested to.      Stephen Bidding, DO    [name]  Physician

## 2018-05-26 ENCOUNTER — Ambulatory Visit (INDEPENDENT_AMBULATORY_CARE_PROVIDER_SITE_OTHER): Payer: Managed Care, Other (non HMO) | Admitting: Family Medicine

## 2018-05-26 ENCOUNTER — Encounter: Payer: Self-pay | Admitting: Family Medicine

## 2018-05-26 VITALS — BP 118/88 | HR 97 | Temp 98.2°F | Ht 66.5 in | Wt 172.2 lb

## 2018-05-26 DIAGNOSIS — H6122 Impacted cerumen, left ear: Secondary | ICD-10-CM

## 2018-05-26 DIAGNOSIS — J302 Other seasonal allergic rhinitis: Secondary | ICD-10-CM

## 2018-05-26 NOTE — Patient Instructions (Signed)
Ears look great after Stephen Gomez irrigated them today  Return to see Stephen Gomez if you have recurrent ear fullness, difficulty hearing.  With your congestion- sometimes you can get fluid behind the eardrum.  I do not see any today.  Perhaps use Zyrtec daily for the next week or 2 until this clears.  Can continue Flonase

## 2018-05-26 NOTE — Progress Notes (Addendum)
Subjective:  Stephen Gomez is a 42 y.Gomez. year old very pleasant male patient who presents for/with See problem oriented charting ROS- no fever or chills. Hearing loss left ear and fullness- denies issues right ear. Some nasal congestion and sneezing   Past Medical History-  Patient Active Problem List   Diagnosis Date Noted  . Hyperlipidemia 10/27/2015    Priority: Medium  . Abnormal liver function tests 11/05/2014    Priority: Medium  . Dizziness and giddiness 11/05/2014    Priority: Low  . Allergic rhinitis 11/02/2011    Priority: Low  . CONSTIPATION, MILD 01/07/2009    Priority: Low  . Closed chip fracture of triquetral bone of right wrist 07/10/2017    Medications- reviewed and updated Current Outpatient Medications  Medication Sig Dispense Refill  . cetirizine (ZYRTEC) 10 MG tablet Take 10 mg by mouth daily.    . fluticasone (FLONASE) 50 MCG/ACT nasal spray Place 1 spray into both nostrils daily.     No current facility-administered medications for this visit.     Objective: BP 118/88 (BP Location: Left Arm, Patient Position: Sitting, Cuff Size: Large)   Pulse 97   Temp 98.2 F (36.8 C) (Oral)   Ht 5' 6.5" (1.689 m)   Wt 172 lb 3.2 oz (78.1 kg)   SpO2 96%   BMI 27.38 kg/m  Gen: NAD, resting comfortably Tympanic membrane normal on the right-obscured partially by cerumen Tympanic membrane obscured on the left completely by cerumen.  Irrigation completed today bilaterally.  After irrigation- minimal cerumen in both tympanic membranes normal. Some drainage in pharynx, nasal turbinates with mild erythema and discharge CV: RRR no murmurs rubs or gallops Lungs: CTAB no crackles, wheeze, rhonchi Ext: no edema Skin: warm, dry  Procedure note: Cerumen noted in left ear.  Irrigation with water and peroxide performed. Full view of tympanic membrane after procedure.  Patient tolerated procedure well  Assessment/Plan:  Left ear cerumen impaction S: Left ear has felt  clogged since last Wednesday.  He [place] the beach out swimming in the waves and after 1 wave hit him he came back up and noted some fullness as well as some difficulty hearing.  He denies pain in either ear.  He has had a similar sensation in the past with cerumen impactions and tried sweet oil-this was not effective  He also admits to some nasal congestion over the last week or so as well as some sneezing.  He is compliant with his Flonase but is not using his Zyrtec. A/P: Irrigation performed today on the left ear with great success.  We actually irrigated the right ear as well though no hearing loss was noted in that ear he did have some cerumen which was successfully irrigated as well.  He reported normal hearing and resolution of ear fullness sensation after irrigation From AVS:  " Ears look great after Stephen Gomez irrigated them today  Return to see Stephen Gomez if you have recurrent ear fullness, difficulty hearing.  With your congestion- sometimes you can get fluid behind the eardrum.  I do not see any today.  Perhaps use Zyrtec daily for the next week or 2 until this clears.  Can continue Flonase "  We also discussed adding Zyrtec to his regular Flonase over the next week or 2 until nasal congestion improves  Future Appointments  Date Time Provider Department Center  07/31/2018 10:45 AM Stephen Gomez, Stephen Campanile O, MD [facility]    Return precautions advised.  Tana ConchStephen Koltan Portocarrero, MD

## 2018-07-31 ENCOUNTER — Encounter: Payer: Self-pay | Admitting: Family Medicine

## 2018-07-31 ENCOUNTER — Ambulatory Visit (INDEPENDENT_AMBULATORY_CARE_PROVIDER_SITE_OTHER): Payer: Managed Care, Other (non HMO) | Admitting: Family Medicine

## 2018-07-31 VITALS — BP 112/80 | HR 75 | Temp 97.9°F | Ht 66.5 in | Wt 173.0 lb

## 2018-07-31 DIAGNOSIS — R945 Abnormal results of liver function studies: Secondary | ICD-10-CM

## 2018-07-31 DIAGNOSIS — Z0000 Encounter for general adult medical examination without abnormal findings: Secondary | ICD-10-CM

## 2018-07-31 DIAGNOSIS — E785 Hyperlipidemia, unspecified: Secondary | ICD-10-CM | POA: Diagnosis not present

## 2018-07-31 DIAGNOSIS — R7989 Other specified abnormal findings of blood chemistry: Secondary | ICD-10-CM

## 2018-07-31 LAB — LIPID PANEL
CHOL/HDL RATIO: 6
Cholesterol: 232 mg/dL — ABNORMAL HIGH (ref 0–200)
HDL: 38.7 mg/dL — AB (ref >39.00)
NONHDL: 193.35
TRIGLYCERIDES: 340 mg/dL — AB (ref 0.0–149.0)
VLDL: 68 mg/dL — ABNORMAL HIGH (ref 0.0–40.0)

## 2018-07-31 LAB — COMPREHENSIVE METABOLIC PANEL
ALT: 64 U/L — AB (ref 0–53)
AST: 31 U/L (ref 0–37)
Albumin: 4.7 g/dL (ref 3.5–5.2)
Alkaline Phosphatase: 51 U/L (ref 39–117)
BUN: 16 mg/dL (ref 6–23)
CALCIUM: 9.7 mg/dL (ref 8.4–10.5)
CHLORIDE: 100 meq/L (ref 96–112)
CO2: 27 meq/L (ref 19–32)
Creatinine, Ser: 0.93 mg/dL (ref 0.40–1.50)
GFR: 95.66 mL/min (ref >60.00)
GLUCOSE: 91 mg/dL (ref 70–99)
Potassium: 4.1 mEq/L (ref 3.5–5.1)
Sodium: 137 mEq/L (ref 135–145)
Total Bilirubin: 0.9 mg/dL (ref 0.2–1.2)
Total Protein: 7.5 g/dL (ref 6.0–8.3)

## 2018-07-31 LAB — CBC WITH DIFFERENTIAL/PLATELET
BASOS ABS: 0.1 10*3/uL (ref 0.0–0.1)
Basophils Relative: 0.8 % (ref 0.0–3.0)
Eosinophils Absolute: 0.1 10*3/uL (ref 0.0–0.7)
Eosinophils Relative: 2 % (ref 0.0–5.0)
HEMATOCRIT: 43 % (ref 39.0–52.0)
Hemoglobin: 15.1 g/dL (ref 13.0–17.0)
LYMPHS ABS: 2.1 10*3/uL (ref 0.7–4.0)
LYMPHS PCT: 32.5 % (ref 12.0–46.0)
MCHC: 35.1 g/dL (ref 30.0–36.0)
MCV: 84.6 fl (ref 78.0–100.0)
Monocytes Absolute: 0.4 10*3/uL (ref 0.1–1.0)
Monocytes Relative: 6.9 % (ref 3.0–12.0)
NEUTROS ABS: 3.8 10*3/uL (ref 1.4–7.7)
Neutrophils Relative %: 57.8 % (ref 43.0–77.0)
PLATELETS: 287 10*3/uL (ref 150.0–400.0)
RBC: 5.08 Mil/uL (ref 4.22–5.81)
RDW: 12.5 % (ref 11.5–15.5)
WBC: 6.6 10*3/uL (ref 4.0–10.5)

## 2018-07-31 LAB — LDL CHOLESTEROL, DIRECT: LDL DIRECT: 150 mg/dL

## 2018-07-31 NOTE — Progress Notes (Signed)
Phone: [phone]  Subjective:  Patient presents today for their annual physical. Chief complaint-noted.   See problem oriented charting- ROS- full  review of systems was completed and negative except for: seasonal allergies  The following were reviewed and entered/updated in epic: Past Medical History:  Diagnosis Date  . GERD (gastroesophageal reflux disease)    improving with exercise  . Hematochezia 1999   fissure   Patient Active Problem List   Diagnosis Date Noted  . Hyperlipidemia 10/27/2015    Priority: Medium  . Abnormal liver function tests 11/05/2014    Priority: Medium  . Dizziness and giddiness 11/05/2014    Priority: Low  . Allergic rhinitis 11/02/2011    Priority: Low  . CONSTIPATION, MILD 01/07/2009    Priority: Low  . Closed chip fracture of triquetral bone of right wrist 07/10/2017   Past Surgical History:  Procedure Laterality Date  . none      Family History  Problem Relation Age of Onset  . Hypertension Mother   . Thyroid disease Mother   . Cancer Mother        GYN  . Breast cancer Mother   . Hypertension Father   . Cancer Cousin        thyroid  . Liver disease Neg Hx    Medications- reviewed and updated Current Outpatient Medications  Medication Sig Dispense Refill  . cetirizine (ZYRTEC) 10 MG tablet Take 10 mg by mouth daily.    . fluticasone (FLONASE) 50 MCG/ACT nasal spray Place 1 spray into both nostrils daily.    . Multiple Vitamins-Minerals (MULTI-VITAMIN GUMMIES PO) Take 2 each by mouth daily.     No current facility-administered medications for this visit.    Allergies-reviewed and updated Allergies  Allergen Reactions  . Tetracyclines & Related Rash   Social History   Social History Narrative   Single. Not dating. Lives with brother and 1 dog      Works as Art gallery managerengineer- for [employer]      Hobbies: relax, watch tv   Objective: BP 112/80 (BP Location: Left Arm, Patient Position: Sitting, Cuff Size: Normal)   Pulse  75   Temp 97.9 F (36.6 C) (Oral)   Ht 5' 6.5" (1.689 m)   Wt 173 lb (78.5 kg)   SpO2 98%   BMI 27.50 kg/m  Gen: NAD, resting comfortably HEENT: Mucous membranes are moist. Oropharynx normal- other than some drainage Neck: no thyromegaly CV: RRR no murmurs rubs or gallops Lungs: CTAB no crackles, wheeze, rhonchi Abdomen: soft/nontender/nondistended/normal bowel sounds. No rebound or guarding. Diastasis recti noted Ext: no edema Skin: warm, dry Neuro: grossly normal, moves all extremities, PERRLA  Assessment/Plan:  42 y.o. male presenting for annual physical.  Health Maintenance counseling: 1. Anticipatory guidance: Patient counseled regarding regular dental exams -q6 months, eye exams - yearly, wearing seatbelts.  2. Risk factor reduction:  Advised patient of need for regular exercise and diet rich and fruits and vegetables to reduce risk of heart attack and stroke. Exercise- goal 3 a week getting 2 at least. Diet-feels improving lately- increasing veggies, fewer hamburgers and bread. We discussed last year trying to get down to 160 (would prefer 150s as he was in 2010 but his goal was 160) Wt Readings from Last 3 Encounters:  07/31/18 173 lb (78.5 kg)  05/26/18 172 lb 3.2 oz (78.1 kg)  08/20/17 171 lb 3.2 oz (77.7 kg)  3. Immunizations/screenings/ancillary studies- advised fall flu shot Immunization History  Administered Date(s) Administered  . Influenza Split  09/19/2011  . Influenza Whole 10/26/2008  . Influenza,inj,Quad PF,6+ Mos 10/18/2014  . Influenza-Unspecified 12/18/2015  . Tdap 09/19/2011  4. Prostate cancer screening-  no family history, start at age 40-55 5. Colon cancer screening -  no family history, start at age 40-50 6. Skin cancer screening/prevention-  No dermatologist. advised regular sunscreen use. Denies worrisome, changing, or new skin lesions.  7. Testicular cancer screening- advised monthly self exams  8. STD screening- patient opts out as not  active/abstinent 9. Never smoker  Status of chronic or acute concerns   Allergies- took some flonase this morning  Base of 5th thumb- gets occasional fasiculations- could be related to prior fracture. No pain. We will monitor- told him not sure this will resolve- may be related to prior injury  Hyperlipidemia S: poorly controlled on last check with LDL 129 and triglycerides at 430.  Lab Results  Component Value Date   CHOL 231 (H) 08/09/2017   HDL 31.30 (L) 08/09/2017   LDLCALC 188 (H) 10/25/2015   LDLDIRECT 129.0 08/09/2017   TRIG (H) 08/09/2017    430.0 Triglyceride is over 400; calculations on Lipids are invalid.   CHOLHDL 7 08/09/2017   A/P: we opted to focus on healthy eating/regular exercise/weight loss  Abnormal liver function tests We will update LFTs again today- once again encouraged healthy eating, regular exercise, weight loss. He has kept alcohol minimal- not even on weekly basis may have 1  Return in about 1 year (around 08/01/2019) for physical.  Lab/Order associations: Hyperlipidemia, unspecified hyperlipidemia type - Plan: CBC with Differential/Platelet, Comprehensive metabolic panel, Lipid panel  Abnormal liver function tests - Plan: CBC with Differential/Platelet, Comprehensive metabolic panel  Return precautions advised.  Tana ConchStephen Neela Zecca, MD

## 2018-07-31 NOTE — Assessment & Plan Note (Signed)
S: poorly controlled on last check with LDL 129 and triglycerides at 430.  Lab Results  Component Value Date   CHOL 231 (H) 08/09/2017   HDL 31.30 (L) 08/09/2017   LDLCALC 188 (H) 10/25/2015   LDLDIRECT 129.0 08/09/2017   TRIG (H) 08/09/2017    430.0 Triglyceride is over 400; calculations on Lipids are invalid.   CHOLHDL 7 08/09/2017   A/P: we opted to focus on healthy eating/regular exercise/weight loss

## 2018-07-31 NOTE — Patient Instructions (Addendum)
Please stop by lab before you go  No changes today other than push a little harder to get to your goal of 160 by next year. Hopefully labs are stable today with cholesterol and liver test.

## 2018-07-31 NOTE — Assessment & Plan Note (Signed)
We will update LFTs again today- once again encouraged healthy eating, regular exercise, weight loss. He has kept alcohol minimal- not even on weekly basis may have 1

## 2018-10-05 IMAGING — DX DG WRIST COMPLETE 3+V*R*
4 series · 4 of 4 positions shown · non-contrast
Comparison: Right wrist films of 06/24/2017

CLINICAL DATA: History of wrist fracture, followup

EXAM:
RIGHT WRIST - COMPLETE 3+ VIEW

[wrist pa]
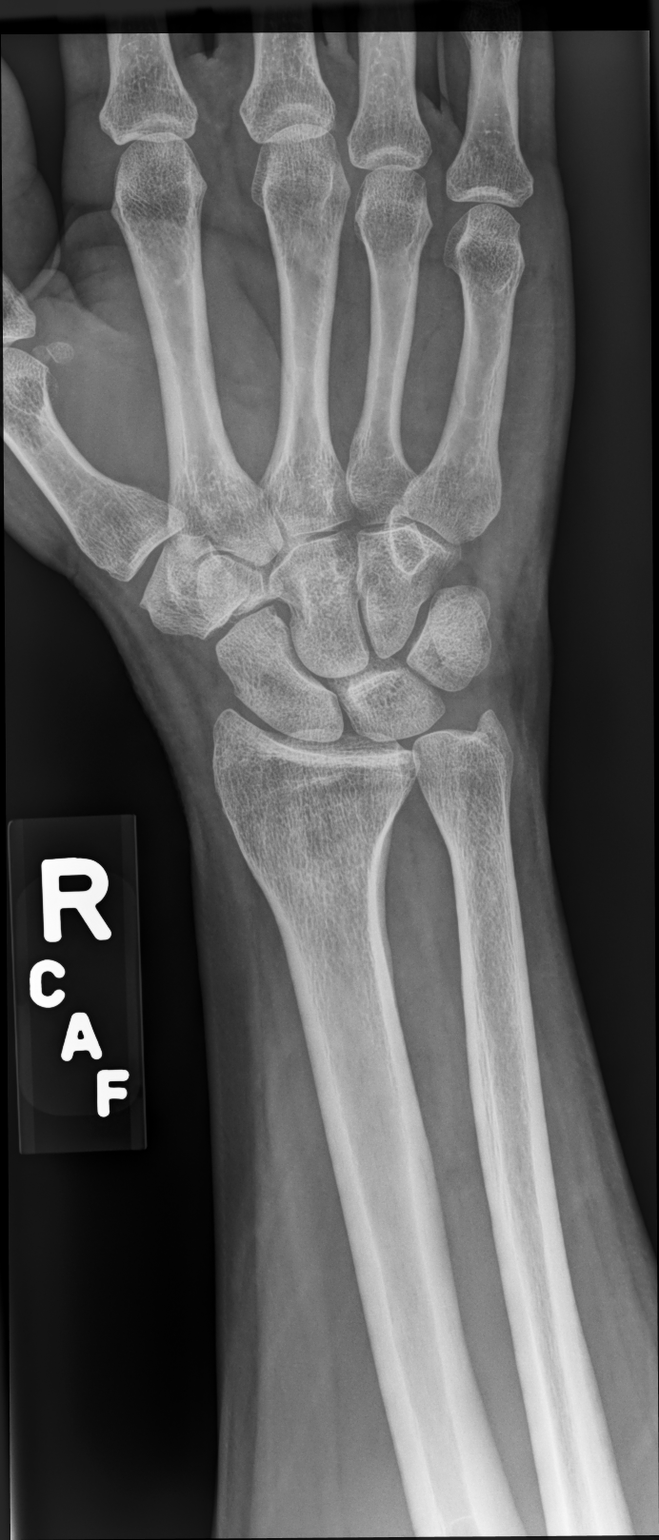

[wrist oblique]
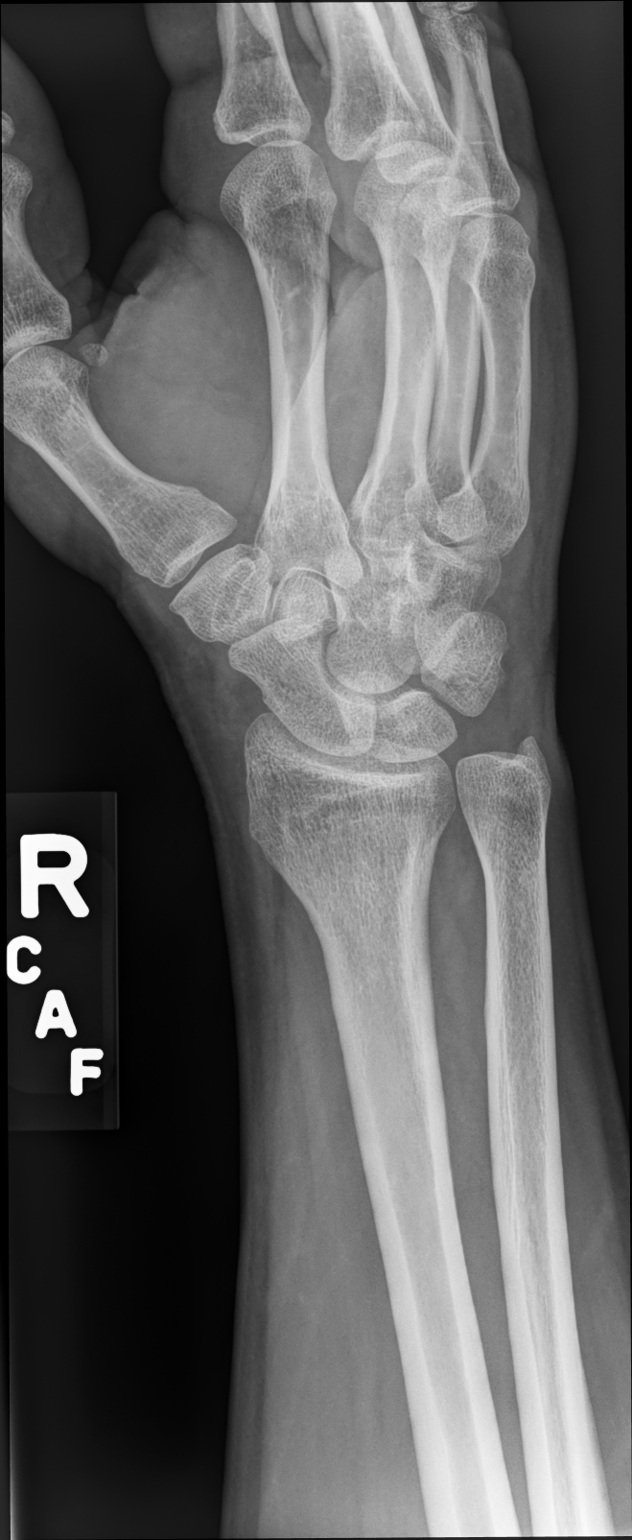

[wrist lat]
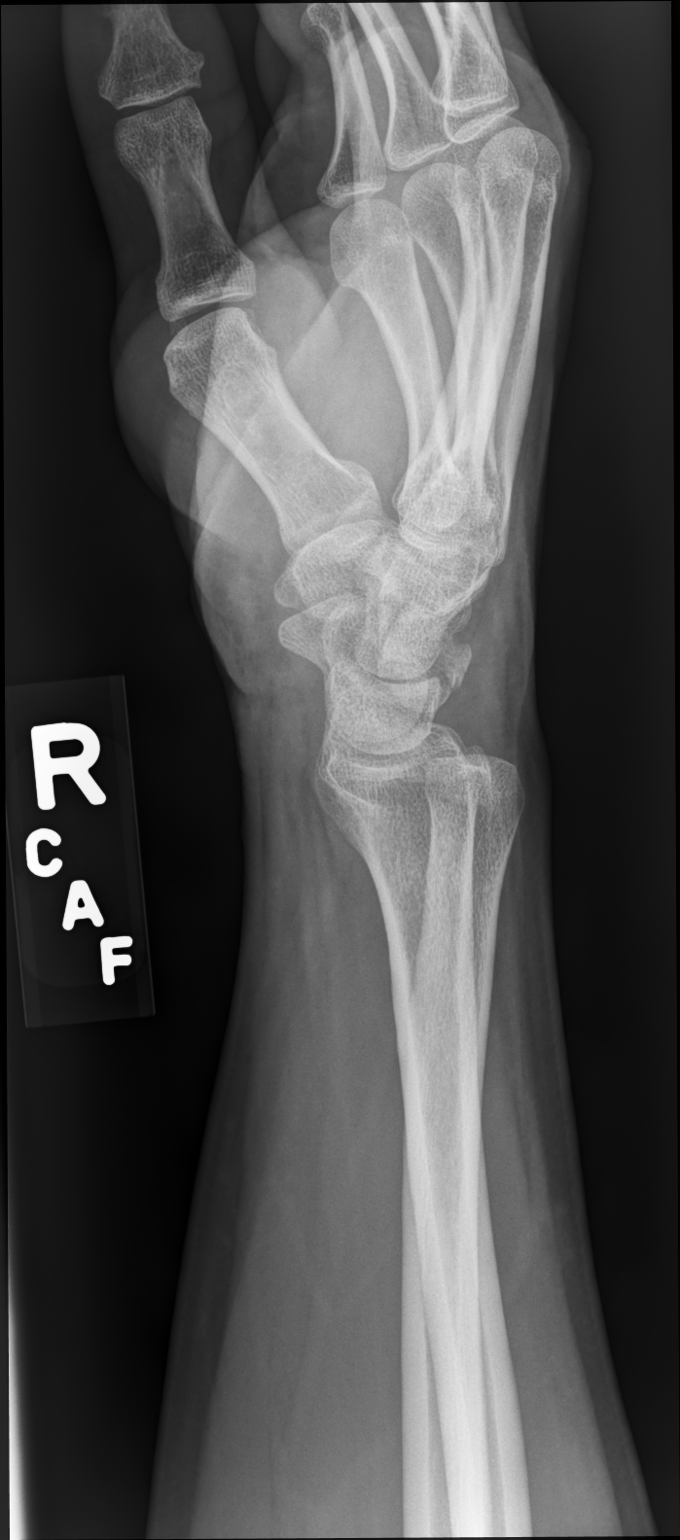

[navicular pa]
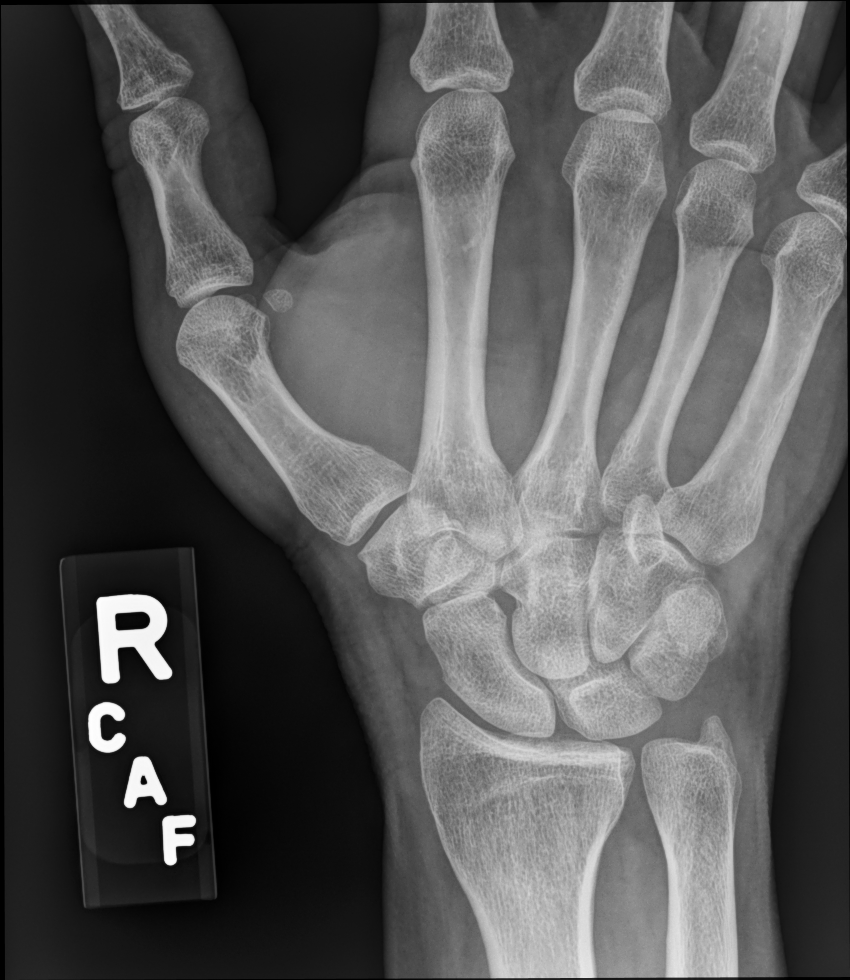

[4 of 4 positions shown; findings below may reference images not displayed]

FINDINGS: The triquetrum fracture line is less well visualized possibly
indicating some interval healing. No further displacement of the
fracture fragment is seen on the lateral view dorsally. The
radiocarpal joint space appears normal. Carpal bone alignment is
normal.
IMPRESSION: Little change to perhaps slight interval healing of triquetrum
fracture.

## 2019-07-31 NOTE — Progress Notes (Signed)
Phone: [phone]    Subjective:  Patient presents today for their annual physical. Chief complaint-noted.   See problem oriented charting- ROS- full  review of systems was completed and negative except for: some seasonal allergies. Even constipation ok lately  The following were reviewed and entered/updated in epic: Past Medical History:  Diagnosis Date  . GERD (gastroesophageal reflux disease)    improving with exercise  . Hematochezia 1999   fissure   Patient Active Problem List   Diagnosis Date Noted  . Hyperlipidemia 10/27/2015    Priority: Medium  . Abnormal liver function tests 11/05/2014    Priority: Medium  . Closed chip fracture of triquetral bone of right wrist 07/10/2017    Priority: Low  . Dizziness and giddiness 11/05/2014    Priority: Low  . Allergic rhinitis 11/02/2011    Priority: Low  . CONSTIPATION, MILD 01/07/2009    Priority: Low   Past Surgical History:  Procedure Laterality Date  . none      Family History  Problem Relation Age of Onset  . Hypertension Mother   . Thyroid disease Mother   . Cancer Mother        GYN  . Breast cancer Mother   . Hypertension Father   . Cancer Cousin        thyroid  . Liver disease Neg Hx     Medications- reviewed and updated Current Outpatient Medications  Medication Sig Dispense Refill  . cetirizine (ZYRTEC) 10 MG tablet Take 10 mg by mouth daily.    . fluticasone (FLONASE) 50 MCG/ACT nasal spray Place 1 spray into both nostrils daily.    . Multiple Vitamins-Minerals (MULTI-VITAMIN GUMMIES PO) Take 2 each by mouth daily.     No current facility-administered medications for this visit.     Allergies-reviewed and updated Allergies  Allergen Reactions  . Tetracyclines & Related Rash    Social History   Social History Narrative   Single. Not dating. Lives with brother. Lost dog [id number]- was 45 years old.       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 Oropharynx normal Neck: no thyromegaly CV: RRR no murmurs rubs or gallops Lungs: CTAB no crackles, wheeze, rhonchi Abdomen: soft/nontender/nondistended/normal bowel sounds. No rebound or guarding.  Ext: no edema Skin: warm, dry Neuro: grossly normal, moves all extremities, PERRLA    Assessment and Plan:  43 y.o. male presenting for annual physical.  Health Maintenance counseling: 1. Anticipatory guidance: Patient counseled regarding regular dental exams -q6 months, eye exams - yearly,  avoiding smoking and second hand smoke , limiting alcohol to 2 beverages per day-rare social.   2. Risk factor reduction:  Advised patient of need for regular exercise and diet rich and fruits and vegetables to reduce risk of heart attack and stroke. Exercise- walking daily- getting 10k most days. Diet-feels like he could work on portion control.  Wt Readings from Last 3 Encounters:  08/03/19 176 lb (79.8 kg)  07/31/18 173 lb (78.5 kg)  05/26/18 172 lb 3.2 oz (78.1 kg)  3. Immunizations/screenings/ancillary studies- advised fall flu  shot.  Immunization History  Administered Date(s) Administered  . Influenza Split 09/19/2011  . Influenza Whole 10/26/2008  . Influenza,inj,Quad PF,6+ Mos 10/18/2014  . Influenza-Unspecified 12/18/2015  . Tdap 09/19/2011  4. Prostate cancer screening- no family history, start at age 41 5. Colon cancer screening - no family history, start at age 41 6. Skin cancer screening/prevention- no dermatologist. advised regular sunscreen use. Denies worrisome, changing, or new skin lesions.  7. Testicular cancer screening- advised monthly self exams  8. STD screening- patient opts out- not  active/abstinent 9. Never smoker  Status of chronic or acute concerns  Hyperlipidemia - mild hyperlipidemia but not at level would typically start statin- continue lifestyle changes  Allergies - Taking Zyrtec and using Flonase.   Mild lipid elevation in past- update with LFTs today. Tries to minimize alchol and work on weight loss Lab Results  Component Value Date   ALT 64 (H) 07/31/2018   AST 31 07/31/2018   ALKPHOS 51 07/31/2018   BILITOT 0.9 07/31/2018   Recommended follow up:  1 year physical or sooner if needed  Lab/Order associations:fasting    ICD-10-CM   1. Preventative health care  Z00.00 CBC with Differential/Platelet    Comprehensive metabolic panel    Lipid panel  2. Hyperlipidemia, unspecified hyperlipidemia type  E78.5 CBC with Differential/Platelet    Comprehensive metabolic panel    Lipid panel  3. Overweight  E66.3    Return precautions advised.   Tana ConchStephen Hunter, MD

## 2019-07-31 NOTE — Patient Instructions (Addendum)
Health Maintenance Due  Topic Date Due  . INFLUENZA VACCINE  07/18/2019  We should have flu shots available by September. Please strongly consider getting flu shot this year. If you get your flu shot at a pharmacy- please let us know.    myfitnesspal could consider for calorie counting. Goal at least 5-10 lbs off by next year  Please stop by lab before you go If you do not have mychart- we will call you about results within 5 business days of [place] receiving them.  If you have mychart- we will send your results within 3 business days of [place] receiving them.  If abnormal or we want to clarify a result, we will call or mychart you to make sure you receive the message.  If you have questions or concerns or don't hear within 5-7 days, please send [place] a message or call us.

## 2019-08-03 ENCOUNTER — Other Ambulatory Visit: Payer: Self-pay

## 2019-08-03 ENCOUNTER — Encounter: Payer: Self-pay | Admitting: Family Medicine

## 2019-08-03 ENCOUNTER — Ambulatory Visit (INDEPENDENT_AMBULATORY_CARE_PROVIDER_SITE_OTHER): Payer: 59 | Admitting: Family Medicine

## 2019-08-03 VITALS — BP 110/78 | HR 81 | Temp 97.5°F | Ht 66.5 in | Wt 176.0 lb

## 2019-08-03 DIAGNOSIS — E663 Overweight: Secondary | ICD-10-CM

## 2019-08-03 DIAGNOSIS — Z0000 Encounter for general adult medical examination without abnormal findings: Secondary | ICD-10-CM | POA: Diagnosis not present

## 2019-08-03 DIAGNOSIS — E785 Hyperlipidemia, unspecified: Secondary | ICD-10-CM

## 2019-08-03 LAB — COMPREHENSIVE METABOLIC PANEL
ALT: 63 U/L — ABNORMAL HIGH (ref 0–53)
AST: 34 U/L (ref 0–37)
Albumin: 4.6 g/dL (ref 3.5–5.2)
Alkaline Phosphatase: 49 U/L (ref 39–117)
BUN: 16 mg/dL (ref 6–23)
CO2: 25 mEq/L (ref 19–32)
Calcium: 9.2 mg/dL (ref 8.4–10.5)
Chloride: 101 mEq/L (ref 96–112)
Creatinine, Ser: 0.78 mg/dL (ref 0.40–1.50)
GFR: 109.7 mL/min (ref >60.00)
Glucose, Bld: 92 mg/dL (ref 70–99)
Potassium: 4 mEq/L (ref 3.5–5.1)
Sodium: 136 mEq/L (ref 135–145)
Total Bilirubin: 0.6 mg/dL (ref 0.2–1.2)
Total Protein: 7.3 g/dL (ref 6.0–8.3)

## 2019-08-03 LAB — CBC WITH DIFFERENTIAL/PLATELET
Basophils Absolute: 0 10*3/uL (ref 0.0–0.1)
Basophils Relative: 0.8 % (ref 0.0–3.0)
Eosinophils Absolute: 0.1 10*3/uL (ref 0.0–0.7)
Eosinophils Relative: 2.4 % (ref 0.0–5.0)
HCT: 41 % (ref 39.0–52.0)
Hemoglobin: 14.2 g/dL (ref 13.0–17.0)
Lymphocytes Relative: 31.8 % (ref 12.0–46.0)
Lymphs Abs: 1.8 10*3/uL (ref 0.7–4.0)
MCHC: 34.6 g/dL (ref 30.0–36.0)
MCV: 85.6 fl (ref 78.0–100.0)
Monocytes Absolute: 0.4 10*3/uL (ref 0.1–1.0)
Monocytes Relative: 6.7 % (ref 3.0–12.0)
Neutro Abs: 3.2 10*3/uL (ref 1.4–7.7)
Neutrophils Relative %: 58.3 % (ref 43.0–77.0)
Platelets: 251 10*3/uL (ref 150.0–400.0)
RBC: 4.79 Mil/uL (ref 4.22–5.81)
RDW: 12.4 % (ref 11.5–15.5)
WBC: 5.5 10*3/uL (ref 4.0–10.5)

## 2019-08-03 LAB — LIPID PANEL
Cholesterol: 231 mg/dL — ABNORMAL HIGH (ref 0–200)
HDL: 37.9 mg/dL — ABNORMAL LOW (ref >39.00)
NonHDL: 193.05
Total CHOL/HDL Ratio: 6
Triglycerides: 370 mg/dL — ABNORMAL HIGH (ref 0.0–149.0)
VLDL: 74 mg/dL — ABNORMAL HIGH (ref 0.0–40.0)

## 2019-08-03 LAB — LDL CHOLESTEROL, DIRECT: Direct LDL: 132 mg/dL

## 2019-10-08 ENCOUNTER — Encounter: Payer: Self-pay | Admitting: Family Medicine

## 2020-06-28 ENCOUNTER — Other Ambulatory Visit (HOSPITAL_COMMUNITY)
Admission: RE | Admit: 2020-06-28 | Discharge: 2020-06-28 | Disposition: A | Discharge status: Home / Self Care | Payer: 59 | Source: Ambulatory Visit | Attending: Physician Assistant | Admitting: Physician Assistant

## 2020-06-28 ENCOUNTER — Other Ambulatory Visit: Payer: Self-pay

## 2020-06-28 ENCOUNTER — Encounter: Payer: Self-pay | Admitting: Physician Assistant

## 2020-06-28 ENCOUNTER — Ambulatory Visit (INDEPENDENT_AMBULATORY_CARE_PROVIDER_SITE_OTHER): Payer: 59 | Admitting: Physician Assistant

## 2020-06-28 VITALS — BP 138/92 | HR 102 | Temp 98.4°F | Ht 66.5 in | Wt 175.6 lb

## 2020-06-28 DIAGNOSIS — S99921A Unspecified injury of right foot, initial encounter: Secondary | ICD-10-CM

## 2020-06-28 DIAGNOSIS — L989 Disorder of the skin and subcutaneous tissue, unspecified: Secondary | ICD-10-CM

## 2020-06-28 NOTE — Patient Instructions (Addendum)
It was great to see you!  Your toenail may fall off. This is ok! We have left it on so it can continue to act as a natural barrier between your sensitive nail bed and the outside world.  Biopsy, Surgery (Curettage) & Surgery (Excision) Aftercare Instructions   1. Okay to remove bandage in 24 hours   2. Wash area with soap and water   3. Apply Vaseline to area twice daily until healed (Not Neosporin)   4. Okay to cover with a Band-Aid to decrease the chance of infection or prevent irritation from clothing; also it's okay to uncover lesion at home.     We will contact you with the biopsy results.   Take care,  Jarold Motto PA-C

## 2020-06-28 NOTE — Progress Notes (Signed)
Stephen Gomez is a 44 y.o. male here for a toenail removal.  I acted as a Neurosurgeon for [employer], PA-C [place], [place]  History of Present Illness:   Chief Complaint  Patient presents with   Nail Problem    HPI   Toenail Pt was attempting to surf when his R great toenail was caught in between the [employer]. Happened on 06/19/2020. Has slight pain at nail fold, with redness. Denies discharge from the nail. Does have history of ingrown toenail with this toe.  Pain is 5/10.   Lesion Has a skin tag or other lesion on the L side of his scalp. He would like this evaluated and possibly removed. Feels like it is getting bigger with time. He hasn't noticed any significant pain with this, only when the hairdresser gets caught on it.   Past Medical History:  Diagnosis Date   GERD (gastroesophageal reflux disease)    improving with exercise   Hematochezia 1999   fissure     Social History   Tobacco Use   Smoking status: Never Smoker   Smokeless tobacco: Never Used  Substance Use Topics   Alcohol use: Yes    Alcohol/week: 0.0 standard drinks    Comment: on weekends only, once a month prior- now very very rare   Drug use: No    Past Surgical History:  Procedure Laterality Date   none      Family History  Problem Relation Age of Onset   Hypertension Mother    Thyroid disease Mother    Cancer Mother        GYN   Breast cancer Mother    Hypertension Father    Cancer Cousin        thyroid   Liver disease Neg Hx     Allergies  Allergen Reactions   Tetracyclines & Related Rash    Current Medications:   Current Outpatient Medications:    cetirizine (ZYRTEC) 10 MG tablet, Take 10 mg by mouth daily., Disp: , Rfl:    fluticasone (FLONASE) 50 MCG/ACT nasal spray, Place 1 spray into both nostrils daily., Disp: , Rfl:    Multiple Vitamins-Minerals (MULTI-VITAMIN GUMMIES PO), Take 2 each by mouth daily., Disp: , Rfl:    Review of Systems:   ROS   Negative unless otherwise specified per HPI.  Vitals:   Vitals:   06/28/20 1437  BP: (!) 138/92  Pulse: (!) 102  Temp: 98.4 F (36.9 C)  TempSrc: Temporal  SpO2: 95%  Weight: 175 lb 9.6 oz (79.7 kg)  Height: 5' 6.5" (1.689 m)     Body mass index is 27.92 kg/m.  Physical Exam:   Physical Exam Vitals and nursing note reviewed.  Constitutional:      Appearance: He is well-developed.  HENT:     Head: Normocephalic.  Eyes:     Conjunctiva/sclera: Conjunctivae normal.     Pupils: Pupils are equal, round, and reactive to light.  Pulmonary:     Effort: Pulmonary effort is normal.  Musculoskeletal:        General: Normal range of motion.     Cervical back: Normal range of motion.  Feet:     Comments: R great toenail with evidence of slight avulsion at distal edge. Erythema, swelling and slight TTP to proximal nail fold. FROM without pain of R great toe. Skin:    General: Skin is warm and dry.     Comments: Fleshy pedunculated mass approximately the size of a  pea to L side of scalp  Neurological:     Mental Status: He is alert and oriented to person, place, and time.  Psychiatric:        Behavior: Behavior normal.        Thought Content: Thought content normal.        Judgment: Judgment normal.     Procedure: Skin tag removal Informed consent:  Discussed risks (permanent scarring, infection, pain, bleeding, bruising, redness, and recurrence of the lesion) and benefits of the procedure, as well as the alternatives.  He is aware that skin tags are benign lesions, and their removal is often not considered medically necessary.  Informed consent was obtained. Anesthesia: lidocaine with epi  The area was prepared and draped in a standard fashion. Snip removal was performed.   Antibiotic ointment and a sterile dressing were applied.    Number of lesions removed:  1   Assessment and Plan:   Caeson was seen today for nail problem.  Diagnoses and all orders for this  visit:  Skin lesion Lesion removed and sent to pathology. The patient tolerated procedure well. The patient was instructed on post-op care.   -     Surgical pathology( [facility])  Injury of toenail of right foot, initial encounter Discussed with Dr. Jacquiline Doe. Consider removal vs keeping toenail intact, risks and benefits of both choices. Patient has opted to keep toenail in place. Discussed hygiene including epsom salt baths. Worsening precautions advised.   Reviewed expectations re: course of current medical issues.  Discussed self-management of symptoms.  Outlined signs and symptoms indicating need for more acute intervention.  Patient verbalized understanding and all questions were answered.  See orders for this visit as documented in the electronic medical record.  Patient received an After-Visit Summary.  CMA or LPN served as scribe during this visit. History, Physical, and Plan performed by medical provider. The above documentation has been reviewed and is accurate and complete.  Jarold Motto, PA-C

## 2020-06-30 LAB — SURGICAL PATHOLOGY

## 2020-09-11 NOTE — Patient Instructions (Addendum)
Schedule a lab visit at the check out desk within 2 weeks. Return for future fasting labs meaning nothing but water after midnight please. Ok to take your medications with water.    Health Maintenance Due  Topic Date Due  . INFLUENZA VACCINE Declined in office flu shot. He would like to wait a little before getting it.  Please let us know when you have received this 07/17/2020

## 2020-09-11 NOTE — Progress Notes (Signed)
Phone: [phone]    Subjective:  Patient presents today for their annual physical. Chief complaint-noted.   See problem oriented charting- ROS- full  review of systems was completed and negative  except for: off and on constipation (figs help)   The following were reviewed and entered/updated in epic: Past Medical History:  Diagnosis Date  . GERD (gastroesophageal reflux disease)    improving with exercise  . Hematochezia 1999   fissure   Patient Active Problem List   Diagnosis Date Noted  . Hyperlipidemia 10/27/2015    Priority: Medium  . Abnormal liver function tests 11/05/2014    Priority: Medium  . Closed chip fracture of triquetral bone of right wrist 07/10/2017    Priority: Low  . Dizziness and giddiness 11/05/2014    Priority: Low  . Allergic rhinitis 11/02/2011    Priority: Low  . CONSTIPATION, MILD 01/07/2009    Priority: Low   Past Surgical History:  Procedure Laterality Date  . none      Family History  Problem Relation Age of Onset  . Hypertension Mother   . Thyroid disease Mother   . Cancer Mother        GYN  . Breast cancer Mother   . Hypertension Father   . Cancer Cousin        thyroid  . Liver disease Neg Hx     Medications- reviewed and updated Current Outpatient Medications  Medication Sig Dispense Refill  . cetirizine (ZYRTEC) 10 MG tablet Take 10 mg by mouth daily.    . fluticasone (FLONASE) 50 MCG/ACT nasal spray Place 1 spray into both nostrils daily.    . Multiple Vitamins-Minerals (MULTI-VITAMIN GUMMIES PO) Take 2 each by mouth daily.     No current facility-administered medications for this visit.    Allergies-reviewed and updated Allergies  Allergen Reactions  . Tetracyclines & Related Rash    Social History   Social History Narrative   Single. Not dating. Lives with brother. Lost dog [id number]- was 71 years old.       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 Oropharynx normal Neck: no thyromegaly CV: RRR no murmurs rubs or gallops Lungs: CTAB no crackles, wheeze, rhonchi Abdomen: soft/nontender/nondistended/normal bowel sounds. No rebound or guarding.  Ext: no edema Skin: warm, dry Neuro: grossly normal, moves all extremities, PERRLA     Assessment and Plan:  44 y.o. Gomez presenting for annual physical.  Health Maintenance counseling: 1. Anticipatory guidance: Patient counseled regarding regular dental exams q6 months, eye exams- yearly ,avoiding smoking and second hand smoke, limiting alcohol to 2 beverages per day.  No illicit drugs 2. Risk factor reduction:  Advised patient of need for regular exercise and diet rich and fruits and vegetables to reduce risk of heart attack and stroke. Exercise- walking and getting 10k most days. Diet- Weight largely stable from last year Wt Readings from Last 3 Encounters:  09/12/20 177 lb 12.8 oz (80.6 kg)  06/28/20 175 lb 9.6 oz (79.7 kg)  08/03/19 176 lb (79.8 kg)  3. Immunizations/screenings/ancillary studies-we will get flu shot later in the season  Immunization History  Administered Date(s) Administered  . Influenza Split 09/19/2011  . Influenza Whole 10/26/2008  . Influenza,inj,Quad PF,6+ Mos 10/18/2014, 09/22/2019  . Influenza-Unspecified 12/18/2015  . Janssen (J&J) SARS-COV-2 Vaccination 03/17/2020  . Tdap 09/19/2011  4. Prostate cancer screening-  No family history, start at age 20  5. Colon cancer screening -  no family history, start at age 83  6. Skin cancer screening/prevention-no dermatologist. advised regular sunscreen use. Denies worrisome, changing, or new skin lesions.  7. Testicular cancer screening- advised monthly self exams  8. STD screening- patient opts out as  abstinent 9.  Never smoker-   Status of chronic or acute concerns   #hyperlipidemia S: Medication: None Lab Results  Component Value Date   CHOL 231 (H) 08/03/2019   HDL 37.90 (L) 08/03/2019   LDLCALC 188 (H) 10/25/2015   LDLDIRECT 132.0 08/03/2019   TRIG 370.0 (H) 08/03/2019   CHOLHDL 6 08/03/2019   A/P: 10-year ASCVD risk at 2.4%-not in range to start cholesterol medication - declines TSH  #Allergies-reasonable control on Zyrtec and Flonase  #ALT elevation-suspect fatty liver-very mild-I recommended weight loss and alcohol avoidance Hepatic Function Latest Ref Rng & Units 08/03/2019 07/31/2018 08/09/2017  Total Protein 6.0 - 8.3 g/dL 7.3 7.5 7.0  Albumin 3.5 - 5.2 g/dL 4.6 4.7 4.6  AST 0 - 37 U/L 34 31 29  ALT 0 - 53 U/L 63(H) Stephen(H) 58(H)  Alk Phosphatase 39 - 117 U/L 49 51 48  Total Bilirubin 0.2 - 1.2 mg/dL 0.6 0.9 0.7  Bilirubin, Direct 0.0 - 0.3 mg/dL - - -    Recommended follow up: Return in about 1 year (around 09/12/2021) for physical or sooner if needed.  Lab/Order associations:Non fasting will return fasting   ICD-10-CM   1. Preventative health care  A56.97 COMPLETE METABOLIC PANEL WITH GFR    Lipid panel    CBC With Differential/Platelet  2. Hyperlipidemia, unspecified hyperlipidemia type  X48.0 COMPLETE METABOLIC PANEL WITH GFR    Lipid panel    CBC With Differential/Platelet  3. Abnormal liver function tests  R94.5     No orders of the defined types were placed in this encounter.   Return precautions advised.   Garret Reddish, MD

## 2020-09-12 ENCOUNTER — Other Ambulatory Visit: Payer: Self-pay

## 2020-09-12 ENCOUNTER — Ambulatory Visit (INDEPENDENT_AMBULATORY_CARE_PROVIDER_SITE_OTHER): Payer: 59 | Admitting: Family Medicine

## 2020-09-12 ENCOUNTER — Encounter: Payer: Self-pay | Admitting: Family Medicine

## 2020-09-12 VITALS — BP 118/70 | HR 101 | Temp 98.3°F | Resp 18 | Ht 67.0 in | Wt 177.8 lb

## 2020-09-12 DIAGNOSIS — R945 Abnormal results of liver function studies: Secondary | ICD-10-CM | POA: Diagnosis not present

## 2020-09-12 DIAGNOSIS — R7989 Other specified abnormal findings of blood chemistry: Secondary | ICD-10-CM

## 2020-09-12 DIAGNOSIS — Z0000 Encounter for general adult medical examination without abnormal findings: Secondary | ICD-10-CM

## 2020-09-12 DIAGNOSIS — E785 Hyperlipidemia, unspecified: Secondary | ICD-10-CM

## 2020-09-20 ENCOUNTER — Other Ambulatory Visit: Payer: Self-pay

## 2020-09-20 ENCOUNTER — Other Ambulatory Visit: Payer: 59

## 2020-09-20 DIAGNOSIS — Z0000 Encounter for general adult medical examination without abnormal findings: Secondary | ICD-10-CM

## 2020-09-20 DIAGNOSIS — E785 Hyperlipidemia, unspecified: Secondary | ICD-10-CM

## 2020-09-21 LAB — COMPLETE METABOLIC PANEL WITH GFR
AG Ratio: 1.7 (calc) (ref 1.0–2.5)
ALT: 60 U/L — ABNORMAL HIGH (ref 9–46)
AST: 53 U/L — ABNORMAL HIGH (ref 10–40)
Albumin: 4.6 g/dL (ref 3.6–5.1)
Alkaline phosphatase (APISO): 51 U/L (ref 36–130)
BUN: 16 mg/dL (ref 7–25)
CO2: 28 mmol/L (ref 20–32)
Calcium: 9.2 mg/dL (ref 8.6–10.3)
Chloride: 102 mmol/L (ref 98–110)
Creat: 0.89 mg/dL (ref 0.60–1.35)
GFR, Est African American: 122 mL/min/{1.73_m2} (ref >=60)
GFR, Est Non African American: 105 mL/min/{1.73_m2} (ref >=60)
Globulin: 2.7 g/dL (calc) (ref 1.9–3.7)
Glucose, Bld: 95 mg/dL (ref 65–99)
Potassium: 4.1 mmol/L (ref 3.5–5.3)
Sodium: 138 mmol/L (ref 135–146)
Total Bilirubin: 1 mg/dL (ref 0.2–1.2)
Total Protein: 7.3 g/dL (ref 6.1–8.1)

## 2020-09-21 LAB — CBC WITH DIFFERENTIAL/PLATELET
Absolute Monocytes: 398 cells/uL (ref 200–950)
Basophils Absolute: 50 cells/uL (ref 0–200)
Basophils Relative: 0.9 %
Eosinophils Absolute: 101 cells/uL (ref 15–500)
Eosinophils Relative: 1.8 %
HCT: 42.2 % (ref 38.5–50.0)
Hemoglobin: 14.4 g/dL (ref 13.2–17.1)
Lymphs Abs: 1876 cells/uL (ref 850–3900)
MCH: 29.4 pg (ref 27.0–33.0)
MCHC: 34.1 g/dL (ref 32.0–36.0)
MCV: 86.3 fL (ref 80.0–100.0)
MPV: 10 fL (ref 7.5–12.5)
Monocytes Relative: 7.1 %
Neutro Abs: 3175 cells/uL (ref 1500–7800)
Neutrophils Relative %: 56.7 %
Platelets: 268 10*3/uL (ref 140–400)
RBC: 4.89 10*6/uL (ref 4.20–5.80)
RDW: 13.1 % (ref 11.0–15.0)
Total Lymphocyte: 33.5 %
WBC: 5.6 10*3/uL (ref 3.8–10.8)

## 2020-09-21 LAB — LIPID PANEL
Cholesterol: 245 mg/dL — ABNORMAL HIGH (ref <200)
HDL: 39 mg/dL — ABNORMAL LOW (ref >=40)
LDL Cholesterol (Calc): 157 mg/dL (calc) — ABNORMAL HIGH
Non-HDL Cholesterol (Calc): 206 mg/dL (calc) — ABNORMAL HIGH (ref <130)
Total CHOL/HDL Ratio: 6.3 (calc) — ABNORMAL HIGH (ref <5.0)
Triglycerides: 319 mg/dL — ABNORMAL HIGH (ref <150)

## 2021-09-14 ENCOUNTER — Other Ambulatory Visit: Payer: Self-pay

## 2021-09-14 ENCOUNTER — Ambulatory Visit (INDEPENDENT_AMBULATORY_CARE_PROVIDER_SITE_OTHER): Payer: 59 | Admitting: Family Medicine

## 2021-09-14 ENCOUNTER — Encounter: Payer: Self-pay | Admitting: Family Medicine

## 2021-09-14 VITALS — BP 136/84 | HR 103 | Temp 98.7°F | Ht 67.0 in | Wt 176.2 lb

## 2021-09-14 DIAGNOSIS — E785 Hyperlipidemia, unspecified: Secondary | ICD-10-CM | POA: Diagnosis not present

## 2021-09-14 DIAGNOSIS — Z0000 Encounter for general adult medical examination without abnormal findings: Secondary | ICD-10-CM | POA: Diagnosis not present

## 2021-09-14 NOTE — Patient Instructions (Addendum)
Please stop by lab before you go If you have mychart- we will send your results within 3 business days of [place] receiving them.  If you do not have mychart- we will call you about results within 5 business days of [place] receiving them.  *please also note that you will see labs on mychart as soon as they post. I will later go in and write notes on them- will say "notes from Dr. Durene Cal"  If you decide to get your flu shot later this season, please message [place] through MyChart and let us know the date of when you received it.  Congratulations on getting down 1 lb down from last year - keep up the good work!  You are not in range to start statin medication - please continue to work on your lifestyle (healthy eating, exercising, etc.)  You could try the half plate method - I think this could be very beneficial for you.  Team please ear irrigate the left ear.  If the spot on the left side of your forehead changes in color or grows in size, please let me know.  Recommended follow up: Return in about 1 year (around 09/14/2022) for a physical or sooner if needed.

## 2021-09-14 NOTE — Addendum Note (Signed)
Addended by: Lorn Junes on: 09/14/2021 04:37 PM   Modules accepted: Orders

## 2021-09-14 NOTE — Progress Notes (Signed)
Phone: [phone]    Subjective:  Patient presents today for their annual physical. Chief complaint-noted.   See problem oriented charting- ROS- full  review of systems was completed and negative  except for: slight cough and slight post nasal drip lingering after covid a month ago- getting better  The following were reviewed and entered/updated in epic: Past Medical History:  Diagnosis Date   GERD (gastroesophageal reflux disease)    improving with exercise   Hematochezia 1999   fissure   Patient Active Problem List   Diagnosis Date Noted   Hyperlipidemia 10/27/2015    Priority: 2.   Abnormal liver function tests 11/05/2014    Priority: 2.   Closed chip fracture of triquetral bone of right wrist 07/10/2017    Priority: 3.   Dizziness and giddiness 11/05/2014    Priority: 3.   Allergic rhinitis 11/02/2011    Priority: 3.   CONSTIPATION, MILD 01/07/2009    Priority: 3.   Past Surgical History:  Procedure Laterality Date   none      Family History  Problem Relation Age of Onset   Hypertension Mother    Thyroid disease Mother    Cancer Mother        GYN   Breast cancer Mother    Hypertension Father    Cancer Cousin        thyroid   Liver disease Neg Hx     Medications- reviewed and updated Current Outpatient Medications  Medication Sig Dispense Refill   cetirizine (ZYRTEC) 10 MG tablet Take 10 mg by mouth daily.     fluticasone (FLONASE) 50 MCG/ACT nasal spray Place 1 spray into both nostrils daily.     Multiple Vitamins-Minerals (MULTI-VITAMIN GUMMIES PO) Take 2 each by mouth daily.     No current facility-administered medications for this visit.    Allergies-reviewed and updated Allergies  Allergen Reactions   Tetracyclines & Related Rash    Social History   Social History Narrative   Single. Not dating. Lives with brother. Lost dog [id number]- was 53 years old.       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 Oropharynx normal. TM normal on right, on left ear obscured by cerumen- team will curette this Neck: no thyromegaly CV: RRR no murmurs rubs or gallops Lungs: CTAB no crackles, wheeze, rhonchi Abdomen: soft/nontender/nondistended/normal bowel sounds. No rebound or guarding.  Ext: no edema Skin: warm, dry Neuro: grossly normal, moves all extremities, PERRLA     Assessment and Plan:  45 y.o. male presenting for annual physical.  Health Maintenance counseling: 1. Anticipatory guidance: Patient counseled regarding regular dental exams -q6 months, eye exams -yearly,  avoiding smoking and second hand smoke , limiting alcohol to 2 beverages per day- per week maybe 1-2, no illicit drugs.   2. Risk factor reduction:  Advised patient of need for regular exercise and diet rich and fruits and vegetables to reduce risk of heart attack and stroke. Exercise- walking and gets 10k most days last year- was doing well until covid and plans to get back . Diet- down 1 lb from last year. Discussed working  on 5 lbs down.  Wt Readings from Last 3 Encounters:  09/14/21 176 lb 3.2 oz (79.9 kg)  09/12/20 177 lb 12.8 oz (80.6 kg)  06/28/20 175 lb 9.6 oz (79.7 kg)  3. Immunizations/screenings/ancillary studies- up-to-date. Essentially had covid omicron booster when had covid last month. Declines flu shot for now- may get later in season Immunization History  Administered Date(s) Administered   Influenza Split 09/19/2011   Influenza Whole 10/26/2008   Influenza,inj,Quad PF,6+ Mos 10/18/2014, 09/22/2019   Influenza-Unspecified 12/18/2015   Janssen (J&J) SARS-COV-2 Vaccination 03/17/2020   Tdap 09/19/2011  4. Prostate cancer screening- no family history, start at age 14 5. Colon cancer  screening - no family history, start at age 73 6. Skin cancer screening/prevention- no dermatologist. advised regular sunscreen use. Denies worrisome, changing, or new skin lesions.  7. Testicular cancer screening- advised monthly self exams  8. STD screening- patient opts out as abstinent 9. Smoking associated screening- Never smoker  Status of chronic or acute concerns   #hyperlipidemia S: Medication:none  - 10-year ASCVD risk at 2.4% last year and was not in range to start statin medication. Lab Results  Component Value Date   CHOL 245 (H) 09/20/2020   HDL 39 (L) 09/20/2020   LDLCALC 157 (H) 09/20/2020   LDLDIRECT 132.0 08/03/2019   TRIG 319 (H) 09/20/2020   CHOLHDL 6.3 (H) 09/20/2020   A/P: Update cholesterol/lipid panel and recalculate 10-year ASCVD risk- suspect not in range for statin- continue to work on lifestyle  #Allergies-reasonable control on Zyrtec and Flonase- regular use of oral- as needed flonase  .#ALT elevation- suspect fatty liver-very mild- recommended weight loss and alcohol avoidance last visit (minimal use)- update today Lab Results  Component Value Date   ALT 60 (H) 09/20/2020   AST 53 (H) 09/20/2020   ALKPHOS 49 08/03/2019   BILITOT 1.0 09/20/2020   Recommended follow up: No follow-ups on file.  Lab/Order associations:NOT fasting   ICD-10-CM   1. Preventative health care  Z00.00 CBC with Differential/Platelet    Comprehensive metabolic panel    Lipid panel    CANCELED: CBC with Differential/Platelet    CANCELED: Comprehensive metabolic panel    CANCELED: Lipid panel    2. Hyperlipidemia, unspecified hyperlipidemia type  E78.5 CBC with Differential/Platelet    Comprehensive metabolic panel    Lipid panel    CANCELED: CBC with Differential/Platelet    CANCELED: Comprehensive metabolic panel    CANCELED: Lipid panel      No orders of the defined types were placed in this encounter.  I,Harris Phan,acting as a Neurosurgeon for Tana Conch,  MD.,have documented all relevant documentation on the behalf of Tana Conch, MD,as directed by  Tana Conch, MD while in the presence of Tana Conch, MD.   I, Tana Conch, MD, have reviewed all documentation for this visit. The documentation on 09/14/21 for the exam, diagnosis, procedures, and orders are all accurate and complete.   Return precautions advised.   Tana Conch, MD

## 2021-09-19 ENCOUNTER — Other Ambulatory Visit: Payer: Self-pay

## 2021-09-19 ENCOUNTER — Other Ambulatory Visit (INDEPENDENT_AMBULATORY_CARE_PROVIDER_SITE_OTHER): Payer: 59

## 2021-09-19 DIAGNOSIS — Z0000 Encounter for general adult medical examination without abnormal findings: Secondary | ICD-10-CM | POA: Diagnosis not present

## 2021-09-19 DIAGNOSIS — E785 Hyperlipidemia, unspecified: Secondary | ICD-10-CM

## 2021-09-19 LAB — COMPREHENSIVE METABOLIC PANEL
ALT: 76 U/L — ABNORMAL HIGH (ref 0–53)
AST: 43 U/L — ABNORMAL HIGH (ref 0–37)
Albumin: 4.5 g/dL (ref 3.5–5.2)
Alkaline Phosphatase: 50 U/L (ref 39–117)
BUN: 18 mg/dL (ref 6–23)
CO2: 27 mEq/L (ref 19–32)
Calcium: 9.2 mg/dL (ref 8.4–10.5)
Chloride: 100 mEq/L (ref 96–112)
Creatinine, Ser: 0.88 mg/dL (ref 0.40–1.50)
GFR: 105.62 mL/min (ref >60.00)
Glucose, Bld: 98 mg/dL (ref 70–99)
Potassium: 4 mEq/L (ref 3.5–5.1)
Sodium: 136 mEq/L (ref 135–145)
Total Bilirubin: 0.9 mg/dL (ref 0.2–1.2)
Total Protein: 7.4 g/dL (ref 6.0–8.3)

## 2021-09-19 LAB — CBC WITH DIFFERENTIAL/PLATELET
Basophils Absolute: 0.1 10*3/uL (ref 0.0–0.1)
Basophils Relative: 1.2 % (ref 0.0–3.0)
Eosinophils Absolute: 0.1 10*3/uL (ref 0.0–0.7)
Eosinophils Relative: 2.7 % (ref 0.0–5.0)
HCT: 42.7 % (ref 39.0–52.0)
Hemoglobin: 14.7 g/dL (ref 13.0–17.0)
Lymphocytes Relative: 34.9 % (ref 12.0–46.0)
Lymphs Abs: 1.9 10*3/uL (ref 0.7–4.0)
MCHC: 34.4 g/dL (ref 30.0–36.0)
MCV: 83.5 fl (ref 78.0–100.0)
Monocytes Absolute: 0.3 10*3/uL (ref 0.1–1.0)
Monocytes Relative: 6.4 % (ref 3.0–12.0)
Neutro Abs: 3 10*3/uL (ref 1.4–7.7)
Neutrophils Relative %: 54.8 % (ref 43.0–77.0)
Platelets: 233 10*3/uL (ref 150.0–400.0)
RBC: 5.12 Mil/uL (ref 4.22–5.81)
RDW: 13.1 % (ref 11.5–15.5)
WBC: 5.5 10*3/uL (ref 4.0–10.5)

## 2021-09-19 LAB — LDL CHOLESTEROL, DIRECT: Direct LDL: 159 mg/dL

## 2021-09-19 LAB — LIPID PANEL
Cholesterol: 250 mg/dL — ABNORMAL HIGH (ref 0–200)
HDL: 38.5 mg/dL — ABNORMAL LOW (ref >39.00)
NonHDL: 211.17
Total CHOL/HDL Ratio: 6
Triglycerides: 343 mg/dL — ABNORMAL HIGH (ref 0.0–149.0)
VLDL: 68.6 mg/dL — ABNORMAL HIGH (ref 0.0–40.0)

## 2021-09-27 ENCOUNTER — Telehealth: Payer: Self-pay

## 2021-09-27 NOTE — Telephone Encounter (Signed)
Patient came in the office to advise he received Flu shot and Tdap vaccine today.

## 2021-09-28 NOTE — Telephone Encounter (Signed)
Vaccines documented.  

## 2022-09-10 ENCOUNTER — Encounter: Payer: Self-pay | Admitting: *Deleted

## 2022-09-20 ENCOUNTER — Ambulatory Visit (INDEPENDENT_AMBULATORY_CARE_PROVIDER_SITE_OTHER): Payer: 59 | Admitting: Family Medicine

## 2022-09-20 ENCOUNTER — Encounter: Payer: Self-pay | Admitting: Family Medicine

## 2022-09-20 VITALS — BP 100/78 | HR 92 | Temp 98.1°F | Ht 67.0 in | Wt 174.6 lb

## 2022-09-20 DIAGNOSIS — E785 Hyperlipidemia, unspecified: Secondary | ICD-10-CM | POA: Diagnosis not present

## 2022-09-20 DIAGNOSIS — Z23 Encounter for immunization: Secondary | ICD-10-CM

## 2022-09-20 DIAGNOSIS — Z0000 Encounter for general adult medical examination without abnormal findings: Secondary | ICD-10-CM

## 2022-09-20 LAB — COMPREHENSIVE METABOLIC PANEL
ALT: 61 U/L — ABNORMAL HIGH (ref 0–53)
AST: 33 U/L (ref 0–37)
Albumin: 4.6 g/dL (ref 3.5–5.2)
Alkaline Phosphatase: 51 U/L (ref 39–117)
BUN: 20 mg/dL (ref 6–23)
CO2: 26 mEq/L (ref 19–32)
Calcium: 9.2 mg/dL (ref 8.4–10.5)
Chloride: 102 mEq/L (ref 96–112)
Creatinine, Ser: 0.88 mg/dL (ref 0.40–1.50)
GFR: 104.88 mL/min (ref >60.00)
Glucose, Bld: 108 mg/dL — ABNORMAL HIGH (ref 70–99)
Potassium: 3.8 mEq/L (ref 3.5–5.1)
Sodium: 137 mEq/L (ref 135–145)
Total Bilirubin: 0.9 mg/dL (ref 0.2–1.2)
Total Protein: 7.7 g/dL (ref 6.0–8.3)

## 2022-09-20 LAB — CBC WITH DIFFERENTIAL/PLATELET
Basophils Absolute: 0.1 10*3/uL (ref 0.0–0.1)
Basophils Relative: 0.9 % (ref 0.0–3.0)
Eosinophils Absolute: 0.1 10*3/uL (ref 0.0–0.7)
Eosinophils Relative: 1.9 % (ref 0.0–5.0)
HCT: 42.7 % (ref 39.0–52.0)
Hemoglobin: 14.7 g/dL (ref 13.0–17.0)
Lymphocytes Relative: 34.3 % (ref 12.0–46.0)
Lymphs Abs: 2.2 10*3/uL (ref 0.7–4.0)
MCHC: 34.6 g/dL (ref 30.0–36.0)
MCV: 83.1 fl (ref 78.0–100.0)
Monocytes Absolute: 0.4 10*3/uL (ref 0.1–1.0)
Monocytes Relative: 6.1 % (ref 3.0–12.0)
Neutro Abs: 3.6 10*3/uL (ref 1.4–7.7)
Neutrophils Relative %: 56.8 % (ref 43.0–77.0)
Platelets: 254 10*3/uL (ref 150.0–400.0)
RBC: 5.13 Mil/uL (ref 4.22–5.81)
RDW: 13.1 % (ref 11.5–15.5)
WBC: 6.3 10*3/uL (ref 4.0–10.5)

## 2022-09-20 LAB — LIPID PANEL
Cholesterol: 239 mg/dL — ABNORMAL HIGH (ref 0–200)
HDL: 41.3 mg/dL (ref >39.00)
NonHDL: 197.77
Total CHOL/HDL Ratio: 6
Triglycerides: 254 mg/dL — ABNORMAL HIGH (ref 0.0–149.0)
VLDL: 50.8 mg/dL — ABNORMAL HIGH (ref 0.0–40.0)

## 2022-09-20 LAB — LDL CHOLESTEROL, DIRECT: Direct LDL: 169 mg/dL

## 2022-09-20 NOTE — Progress Notes (Signed)
Phone: [phone]    Subjective:  Patient presents today for their annual physical. Chief complaint-noted.   See problem oriented charting- ROS- full  review of systems was completed and negative  Per full ROS sheet completed by patient- had ear pain about 3 weeks ago but resolved with nasal spray, prior back pain but changed bed  The following were reviewed and entered/updated in epic: Past Medical History:  Diagnosis Date   GERD (gastroesophageal reflux disease)    improving with exercise   Hematochezia 1999   fissure   Patient Active Problem List   Diagnosis Date Noted   Hyperlipidemia 10/27/2015    Priority: Medium    Abnormal liver function tests 11/05/2014    Priority: Medium    Closed chip fracture of triquetral bone of right wrist 07/10/2017    Priority: Low   Dizziness and giddiness 11/05/2014    Priority: Low   Allergic rhinitis 11/02/2011    Priority: Low   CONSTIPATION, MILD 01/07/2009    Priority: Low   Past Surgical History:  Procedure Laterality Date   none      Family History  Problem Relation Age of Onset   Hypertension Mother    Thyroid disease Mother    Cancer Mother        GYN   Breast cancer Mother    Hypertension Father    Other Father        was told cardiac insufficiency- was 71   Cancer Cousin        thyroid   Liver disease Neg Hx     Medications- reviewed and updated Current Outpatient Medications  Medication Sig Dispense Refill   cetirizine (ZYRTEC) 10 MG tablet Take 10 mg by mouth daily.     fluticasone (FLONASE) 50 MCG/ACT nasal spray Place 1 spray into both nostrils daily.     Multiple Vitamins-Minerals (MULTI-VITAMIN GUMMIES PO) Take 2 each by mouth daily.     No current facility-administered medications for this visit.    Allergies-reviewed and updated Allergies  Allergen Reactions   Tetracyclines & Related Rash    Social History   Social History Narrative   Single. Not dating. Lives with brother.        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 Oropharynx normal Neck: no thyromegaly CV: RRR no murmurs rubs or gallops Lungs: CTAB no crackles, wheeze, rhonchi Abdomen: soft/nontender/nondistended/normal bowel sounds. No rebound or guarding.  Ext: no edema Skin: warm, dry Neuro: grossly normal, moves all extremities, PERRLA    Assessment and Plan:  46 y.o. male presenting for annual physical.  Health Maintenance counseling: 1. Anticipatory guidance: Patient counseled regarding regular dental exams -q6 months, eye exams - yearly,  avoiding smoking and second hand smoke , limiting alcohol to 2 beverages per day- has pretty much cut out recently, no illicit drugs.   2. Risk factor reduction:  Advised patient of need for regular exercise and diet rich and fruits and vegetables to reduce risk of heart attack and stroke.  Exercise- has been more active trying to help mom after loss of dad, doing some walks 10-15 minutes 2 days a week.  Diet/weight management-down 2 more lbs from last year- down 1 lb last year- encouraged continued gradual weight loss.  Wt Readings from Last 3 Encounters:  09/20/22 174 lb 9.6 oz (79.2 kg)  09/14/21 176 lb 3.2 oz (79.9 kg)  09/12/20 177 lb 12.8 oz (80.6 kg)  3. Immunizations/screenings/ancillary studies- flu shot today. Opts out of covid shot- did ok with covid last year Immunization History  Administered Date(s) Administered   Influenza Split 09/19/2011   Influenza Whole 10/26/2008   Influenza,inj,Quad PF,6+ Mos 10/18/2014, 09/22/2019, 09/28/2021, 09/20/2022   Influenza-Unspecified 12/18/2015   Janssen (J&J) SARS-COV-2 Vaccination 03/17/2020   Tdap 09/19/2011, 09/28/2021  4. Prostate cancer screening- no family history,  start at age 75  5. Colon cancer screening - no family history, start at age 64 6. Skin cancer screening/prevention- no dermatologist . advised regular sunscreen use. Denies worrisome, changing, or new skin lesions- does have some stable cherry angiomas 7. Testicular cancer screening- advised monthly self exams  8. STD screening- patient opts out as abstinent 9. Smoking associated screening- Never smoker  Status of chronic or acute concerns   #social update- lost dad beginning of the year. A lot of work going through his old things- not sure of moms plan- she lives in [place]  #hyperlipidemia S: Medication:none  - 10 year ascvd risk usually under 3% and no CAD history in family Lab Results  Component Value Date   CHOL 250 (H) 09/19/2021   HDL 38.50 (L) 09/19/2021   LDLCALC 157 (H) 09/20/2020   LDLDIRECT 159.0 09/19/2021   TRIG 343.0 (H) 09/19/2021   CHOLHDL 6 09/19/2021  A/P: as [place] as risk remains under 5% hold off on further workup- could consider 5% threshold for ct cardiac scoring with dad's history. Encouraged bumping exercise.   # Allergies S:medication: Reasonable control on Zyrtec (regular) and Flonase (as needed)- did flonase recently and helped ear discomfort  A/P: Controlled. Continue current medications.    #Mild AST/ALT elevations-suspect mild fatty liver.  Very mild elevations-have recommended weight loss and alcohol avoidance (uses minimally- and recently pretty much none)- mild weight loss as well Lab Results  Component Value Date   ALT 76 (H) 09/19/2021   AST 43 (H) 09/19/2021   ALKPHOS 50 09/19/2021   BILITOT 0.9 09/19/2021   Recommended follow up: Return in about 1 year (around 09/21/2023) for physical or sooner if needed.Schedule b4 you leave.  Lab/Order associations: fasting   ICD-10-CM   1. Preventative health care  Z00.00     2. Need for immunization against influenza  Z23 Flu Vaccine QUAD 47mo+IM (Fluarix, Fluzone & Alfiuria Quad PF)    3. Hyperlipidemia,  unspecified hyperlipidemia type  E78.5 CBC with Differential/Platelet    Comprehensive metabolic panel    Lipid panel     No orders of the defined types were placed in this encounter.  Return precautions advised.  Tana Conch, MD

## 2022-09-20 NOTE — Patient Instructions (Addendum)
Please stop by lab before you go If you have mychart- we will send your results within 3 business days of [place] receiving them.  If you do not have mychart- we will call you about results within 5 business days of [place] receiving them.  *please also note that you will see labs on mychart as soon as they post. I will later go in and write notes on them- will say "notes from Dr. Yong Channel"   Recommended follow up: Return in about 1 year (around 09/21/2023) for physical or sooner if needed.Schedule b4 you leave.

## 2023-09-26 ENCOUNTER — Encounter: Payer: 59 | Admitting: Family Medicine

## 2024-01-16 ENCOUNTER — Ambulatory Visit: Payer: 59 | Admitting: Family Medicine

## 2024-01-16 ENCOUNTER — Encounter: Payer: Self-pay | Admitting: Family Medicine

## 2024-01-16 VITALS — BP 124/82 | HR 93 | Temp 97.6°F | Ht 67.0 in | Wt 182.6 lb

## 2024-01-16 DIAGNOSIS — Z0000 Encounter for general adult medical examination without abnormal findings: Secondary | ICD-10-CM

## 2024-01-16 DIAGNOSIS — Z131 Encounter for screening for diabetes mellitus: Secondary | ICD-10-CM

## 2024-01-16 DIAGNOSIS — E663 Overweight: Secondary | ICD-10-CM | POA: Diagnosis not present

## 2024-01-16 DIAGNOSIS — Z1211 Encounter for screening for malignant neoplasm of colon: Secondary | ICD-10-CM

## 2024-01-16 DIAGNOSIS — Z130 Encounter for screening for diseases of the blood and blood-forming organs and certain disorders involving the immune mechanism: Secondary | ICD-10-CM | POA: Diagnosis not present

## 2024-01-16 DIAGNOSIS — Z13220 Encounter for screening for lipoid disorders: Secondary | ICD-10-CM | POA: Diagnosis not present

## 2024-01-16 LAB — CBC WITH DIFFERENTIAL/PLATELET
Basophils Absolute: 0 10*3/uL (ref 0.0–0.1)
Basophils Relative: 0.8 % (ref 0.0–3.0)
Eosinophils Absolute: 0.1 10*3/uL (ref 0.0–0.7)
Eosinophils Relative: 1.3 % (ref 0.0–5.0)
HCT: 41.1 % (ref 39.0–52.0)
Hemoglobin: 14.1 g/dL (ref 13.0–17.0)
Lymphocytes Relative: 30.2 % (ref 12.0–46.0)
Lymphs Abs: 1.9 10*3/uL (ref 0.7–4.0)
MCHC: 34.4 g/dL (ref 30.0–36.0)
MCV: 85.4 fL (ref 78.0–100.0)
Monocytes Absolute: 0.4 10*3/uL (ref 0.1–1.0)
Monocytes Relative: 5.7 % (ref 3.0–12.0)
Neutro Abs: 3.9 10*3/uL (ref 1.4–7.7)
Neutrophils Relative %: 62 % (ref 43.0–77.0)
Platelets: 267 10*3/uL (ref 150.0–400.0)
RBC: 4.81 Mil/uL (ref 4.22–5.81)
RDW: 12.6 % (ref 11.5–15.5)
WBC: 6.4 10*3/uL (ref 4.0–10.5)

## 2024-01-16 LAB — COMPREHENSIVE METABOLIC PANEL
ALT: 64 U/L — ABNORMAL HIGH (ref 0–53)
AST: 38 U/L — ABNORMAL HIGH (ref 0–37)
Albumin: 4.5 g/dL (ref 3.5–5.2)
Alkaline Phosphatase: 49 U/L (ref 39–117)
BUN: 15 mg/dL (ref 6–23)
CO2: 25 meq/L (ref 19–32)
Calcium: 9.1 mg/dL (ref 8.4–10.5)
Chloride: 102 meq/L (ref 96–112)
Creatinine, Ser: 0.74 mg/dL (ref 0.40–1.50)
GFR: 109.49 mL/min (ref >60.00)
Glucose, Bld: 92 mg/dL (ref 70–99)
Potassium: 3.6 meq/L (ref 3.5–5.1)
Sodium: 138 meq/L (ref 135–145)
Total Bilirubin: 0.6 mg/dL (ref 0.2–1.2)
Total Protein: 7.2 g/dL (ref 6.0–8.3)

## 2024-01-16 LAB — LIPID PANEL
Cholesterol: 236 mg/dL — ABNORMAL HIGH (ref 0–200)
HDL: 40.5 mg/dL (ref >39.00)
LDL Cholesterol: 136 mg/dL — ABNORMAL HIGH (ref 0–99)
NonHDL: 195.74
Total CHOL/HDL Ratio: 6
Triglycerides: 301 mg/dL — ABNORMAL HIGH (ref 0.0–149.0)
VLDL: 60.2 mg/dL — ABNORMAL HIGH (ref 0.0–40.0)

## 2024-01-16 LAB — HEMOGLOBIN A1C: Hgb A1c MFr Bld: 5.9 % (ref 4.6–6.5)

## 2024-01-16 NOTE — Progress Notes (Signed)
Phone: [phone]   Subjective:  Patient presents today for their annual physical. Chief complaint-noted.   See problem oriented charting- ROS- full  review of systems was completed and negative  Per full ROS sheet completed by patient  The following were reviewed and entered/updated in epic: Past Medical History:  Diagnosis Date   GERD (gastroesophageal reflux disease)    improving with exercise   Hematochezia 1999   fissure   Patient Active Problem List   Diagnosis Date Noted   Hyperlipidemia 10/27/2015    Priority: Medium    Abnormal liver function tests 11/05/2014    Priority: Medium    Closed chip fracture of triquetral bone of right wrist 07/10/2017    Priority: Low   Dizziness and giddiness 11/05/2014    Priority: Low   Allergic rhinitis 11/02/2011    Priority: Low   Constipation 01/07/2009    Priority: Low   Past Surgical History:  Procedure Laterality Date   none      Family History  Problem Relation Age of Onset   Hypertension Mother    Thyroid disease Mother    Cancer Mother        GYN   Breast cancer Mother    Hypertension Father    Other Father        was told cardiac insufficiency- was 106   Healthy Brother    Hypertension Brother    Cancer Cousin        thyroid   Liver disease Neg Hx     Medications- reviewed and updated Current Outpatient Medications  Medication Sig Dispense Refill   cetirizine (ZYRTEC) 10 MG tablet Take 10 mg by mouth daily.     fluticasone (FLONASE) 50 MCG/ACT nasal spray Place 1 spray into both nostrils daily.     Multiple Vitamins-Minerals (MULTI-VITAMIN GUMMIES PO) Take 2 each by mouth daily.     No current facility-administered medications for this visit.    Allergies-reviewed and updated Allergies  Allergen Reactions   Tetracyclines & Related Rash    Social History   Social History Narrative   Single. Not dating. Lives with brother and mom in [place] most of time in 2025.       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 Oropharynx normal Neck: no thyromegaly CV: RRR no murmurs rubs or gallops Lungs: CTAB no crackles, wheeze, rhonchi Abdomen: soft/nontender/nondistended/normal bowel sounds. No rebound or guarding.  Ext: no edema Skin: warm, dry Neuro: grossly normal, moves all extremities, PERRLA Declines genitourinary or rectal exam    Assessment and Plan  48 y.o. male presenting for annual physical.  Health Maintenance counseling: 1. Anticipatory guidance: Patient counseled regarding regular dental exams -q6 months, eye exams - missed a few years but no major changes,  avoiding smoking and second hand smoke , limiting alcohol to 2 beverages per day-rare intake, no illicit drugs .   2. Risk factor reduction:  Advised patient of need for regular exercise and diet rich and fruits and vegetables to reduce risk of heart attack and stroke.  Exercise-last physical doing some walks 10 to 15 minutes 2 days a week-had wanted to increase  but has been having to help with a lot after the loss of his dad- not walking as much and active with moving though- would love to see more consistent regular exercise.  Diet/weight management-weight up 8 lbs from last visit. Encouraged to reduce portions- brother makes large good meals Wt Readings from Last 3 Encounters:  01/16/24 182 lb 9.6 oz (82.8 kg)  09/20/22 174 lb 9.6 oz (79.2 kg)  09/14/21 176 lb 3.2 oz (79.9 kg)  3. Immunizations/screenings/ancillary studies-declines further COVID-19 vaccinations  Immunization History  Administered Date(s) Administered   Influenza Split 09/19/2011   Influenza Whole 10/26/2008   Influenza,inj,Quad PF,6+ Mos 10/18/2014, 09/22/2019, 09/28/2021, 09/20/2022    Influenza-Unspecified 12/18/2015, 11/01/2023   Janssen (J&J) SARS-COV-2 Vaccination 03/17/2020   Tdap 09/19/2011, 09/28/2021  4. Prostate cancer screening-  no family history, start at age 85   5. Colon cancer screening - refer for colonoscopy today 6. Skin cancer screening-no dermatologist. advised regular sunscreen use. Denies worrisome, changing, or new skin lesions.  7. Smoking associated screening (lung cancer screening, AAA screen 65-75, UA)-never smoker 8. STD screening - opts out as abstinent  9.  Testicular cancer screening-advised monthly self exams   Status of chronic or acute concerns   # Social update-had lost his dad 03/05/2022 and still living in [place] and not sure of [place]-term plans but he was helping a lot-he reports he is pretty much living out of [place] now supporting her- about 75% of the time up there- able to work remote  - still helping her a lot - dads birthday yesterday that was tough -brother also with them  #screening hyperlipidemia- needs fasting S: Medication:none  - 10 year ascvd risk usually around 3%- dad died at 70 - was told cardiac insuffiencey The 10-year ASCVD risk score (Arnett DK, et al., 2019) is: 3.3% Lab Results  Component Value Date   CHOL 239 (H) 09/20/2022   HDL 41.30 09/20/2022   LDLCALC 157 (H) 09/20/2020   LDLDIRECT 169.0 09/20/2022   TRIG 254.0 (H) 09/20/2022   CHOLHDL 6 09/20/2022  A/P: hopefully stable- update lipid panel today. Continue without meds for now   # Allergies S:medication: Reasonable control on Zyrtec (regular) and Flonase (as needed)  A/P: stable- continue current medicines    #Mild AST/ALT elevations-suspect mild fatty liver.  Very mild elevations-have recommended weight loss and alcohol avoidance (uses minimally) -Numbers improved last year with alcohol avoidance-encouraged continued weight loss and update levels today. Maybe higher with weight gain    Latest Ref Rng & Units 09/20/2022    8:26 AM 09/19/2021    10:52 AM 09/20/2020    8:04 AM  Hepatic Function  Total Protein 6.0 - 8.3 g/dL 7.7  7.4  7.3   Albumin 3.5 - 5.2 g/dL 4.6  4.5    AST 0 - 37 U/L 33  43  53   ALT 0 - 53 U/L 61  76  60   Alk Phosphatase 39 - 117 U/L 51  50    Total Bilirubin 0.2 - 1.2 mg/dL 0.9  0.9  1.0     #small ganglion cyst on right thumb noted- no pain- wants to monitor  Recommended follow up: Return in about 1 year (around 01/15/2025) for physical or sooner if needed.Schedule b4 you leave.  Lab/Order associations: fasting other than cream in cofee   ICD-10-CM   1. Preventative health care  Z00.00     2. Screening for hyperlipidemia  Z13.220     3. Screening for deficiency  anemia  Z13.0     4. Screen for colon cancer  Z12.11     5. Screening for diabetes mellitus  Z13.1     6. Overweight  E66.3       No orders of the defined types were placed in this encounter.   Return precautions advised.  Tana Conch, MD

## 2024-01-16 NOTE — Patient Instructions (Addendum)
Health Maintenance Due  Topic Date Due   Colonoscopy  Never done  [facility] contact Please call to schedule visit and/or procedure Address: [address] [place], [place], [place] [address] Phone: [phone]   Please stop by lab before you go If you have mychart- we will send your results within 3 business days of [place] receiving them.  If you do not have mychart- we will call you about results within 5 business days of [place] receiving them.  *please also note that you will see labs on mychart as soon as they post. I will later go in and write notes on them- will say "notes from Dr. Durene Cal"   Recommended follow up: Return in about 1 year (around 01/15/2025) for physical or sooner if needed.Schedule b4 you leave.

## 2024-08-03 ENCOUNTER — Encounter: Payer: Self-pay | Admitting: Gastroenterology

## 2024-09-22 ENCOUNTER — Ambulatory Visit (AMBULATORY_SURGERY_CENTER): Admitting: *Deleted

## 2024-09-22 ENCOUNTER — Encounter: Payer: Self-pay | Admitting: Gastroenterology

## 2024-09-22 VITALS — Ht 67.0 in | Wt 170.0 lb

## 2024-09-22 DIAGNOSIS — Z1211 Encounter for screening for malignant neoplasm of colon: Secondary | ICD-10-CM

## 2024-09-22 MED ORDER — NA SULFATE-K SULFATE-MG SULF 17.5-3.13-1.6 GM/177ML PO SOLN: 1.0000 | Freq: Once | ORAL | 0 refills | Status: AC

## 2024-09-22 NOTE — Progress Notes (Signed)
 Pt's name and DOB verified at the beginning of the pre-visit with 2 identifiers  Pt denies any difficulty with ambulating,sitting, laying down or rolling side to side  Pt has no issues moving head neck or swallowing  No egg or soy allergy known to patient   No issues known to pt with past sedation  No FH of Malignant Hyperthermia  Pt is not on home 02   Pt is not on blood thinners   Pt has frequent issues with constipation RN instructed pt to use Miralax per bottles instructions a week before prep days. Pt states they will  Pt is not on dialysis  Pt denise any abnormal heart rhythms   Pt denies any upcoming cardiac testing  Patient's chart reviewed by Norleen Schillings CNRA prior to pre-visit and patient appropriate for the [facility].  Pre-visit completed and red dot placed by patient's name on their procedure day (on provider's schedule).    Visit by video call  Pt states weight is 170 lb   Pt given  both [facility] main # and MD on call # prior to instructions.  Informed pt to come in at the time discussed and is shown on PV instructions.  Pt instructed to use Singlecare.com or GoodRx for a price reduction on prep  Instructed pt where to find PV instructions in My Ch. Copy of instructions  to be sent in mail and address read back to pt to verify correct on envelope. Instructed pt on all aspects of written instructions including med holds clothing to wear and foods to eat and not eat as well as after procedure legal restrictions and to call MD on call if needed.. Pt states understanding. Instructed pt to review instructions again prior to procedure and call main # given if has any questions or any issues. Pt states they will.

## 2024-10-06 ENCOUNTER — Encounter: Payer: Self-pay | Admitting: Gastroenterology

## 2024-10-06 ENCOUNTER — Ambulatory Visit (AMBULATORY_SURGERY_CENTER): Admitting: Gastroenterology

## 2024-10-06 VITALS — BP 123/88 | HR 80 | Temp 97.5°F | Resp 11 | Ht 67.0 in | Wt 170.0 lb

## 2024-10-06 DIAGNOSIS — Z1211 Encounter for screening for malignant neoplasm of colon: Secondary | ICD-10-CM | POA: Diagnosis present

## 2024-10-06 DIAGNOSIS — K640 First degree hemorrhoids: Secondary | ICD-10-CM

## 2024-10-06 DIAGNOSIS — K5730 Diverticulosis of large intestine without perforation or abscess without bleeding: Secondary | ICD-10-CM | POA: Diagnosis not present

## 2024-10-06 MED ORDER — SODIUM CHLORIDE 0.9 % IV SOLN: 500.0000 mL | INTRAVENOUS | Status: DC

## 2024-10-06 NOTE — Op Note (Signed)
 [facility] Patient Name: Stephen Gomez Procedure Date: 10/06/2024 9:01 AM MRN: [id number] Endoscopist: Lynnie Bring , MD, [id number] Age: 48 Referring MD:  Date of Birth: 18-May-1976 Gender: Male Account #: [id number] Procedure:                Colonoscopy Indications:              Screening for colorectal malignant neoplasm Medicines:                Monitored Anesthesia Care Procedure:                Pre-Anesthesia Assessment:                           - Prior to the procedure, a History and Physical                            was performed, and patient medications and                            allergies were reviewed. The patient's tolerance of                            previous anesthesia was also reviewed. The risks                            and benefits of the procedure and the sedation                            options and risks were discussed with the patient.                            All questions were answered, and informed consent                            was obtained. Prior Anticoagulants: The patient has                            taken no anticoagulant or antiplatelet agents. ASA                            Grade Assessment: II - A patient with mild systemic                            disease. After reviewing the risks and benefits,                            the patient was deemed in satisfactory condition to                            undergo the procedure.                           After obtaining informed consent, the colonoscope  was passed under direct vision. Throughout the                            procedure, the patient's blood pressure, pulse, and                            oxygen saturations were monitored continuously. The                            [id number]-HQ190L Colonoscope [id number] was introduced                            through the anus and advanced to the the cecum,                            identified by appendiceal  orifice and ileocecal                            valve. The colonoscopy was performed without                            difficulty. The patient tolerated the procedure                            well. The quality of the bowel preparation was                            good. The ileocecal valve, appendiceal orifice, and                            rectum were photographed. Scope In: 9:13:36 AM Scope Out: 9:30:05 AM Scope Withdrawal Time: 0 hours 12 minutes 24 seconds  Total Procedure Duration: 0 hours 16 minutes 29 seconds  Findings:                 A few small-mouthed diverticula were found in the                            sigmoid colon and descending colon. Rare in                            transverse colon.                           Non-bleeding internal hemorrhoids were found during                            retroflexion. The hemorrhoids were small and Grade                            I (internal hemorrhoids that do not prolapse).                           Retroflexion in the right colon was performed.  The exam was otherwise without abnormality on                            direct and retroflexion views. Complications:            No immediate complications. Estimated Blood Loss:     Estimated blood loss: none. Impression:               - Mild pancolonic diverticulosis.                           - Non-bleeding internal hemorrhoids.                           - The examination was otherwise normal on direct                            and retroflexion views.                           - No specimens collected. Recommendation:           - Patient has a contact number available for                            emergencies. The signs and symptoms of potential                            delayed complications were discussed with the                            patient. Return to normal activities tomorrow.                            Written discharge instructions were  provided to the                            patient.                           - High fiber diet.                           - Continue present medications.                           - Repeat colonoscopy in 10 years for screening                            purposes. Earlier, with any new problems or change                            in family history.                           - The findings and recommendations were discussed  with the patient's family. Lynnie Bring, MD 10/06/2024 9:34:50 AM This report has been signed electronically.

## 2024-10-06 NOTE — Progress Notes (Signed)
 Pt's states no medical or surgical changes since previsit or office visit.

## 2024-10-06 NOTE — Progress Notes (Signed)
 [facility] History and Physical   Primary Care Physician:  Katrinka Garnette KIDD, MD   Reason for Procedure:   CRC screening  Plan:    colon     HPI: Stephen Gomez is a 48 y.o. male    Past Medical History:  Diagnosis Date   Abnormal liver enzymes    Allergy    Constipation    GERD (gastroesophageal reflux disease)    improving with exercise   Hematochezia 12/17/1997   fissure   Hyperlipidemia     Past Surgical History:  Procedure Laterality Date   none      Prior to Admission medications   Medication Sig Start Date End Date Taking? Authorizing Provider  cetirizine (ZYRTEC) 10 MG tablet Take 10 mg by mouth daily.   Yes [provider]  fluticasone (FLONASE) 50 MCG/ACT nasal spray Place 1 spray into both nostrils daily. Patient taking differently: Place 1 spray into both nostrils as needed.   Yes [provider]  Multiple Vitamins-Minerals (MULTI-VITAMIN GUMMIES PO) Take 2 each by mouth daily.   Yes [provider]    Current Outpatient Medications  Medication Sig Dispense Refill   cetirizine (ZYRTEC) 10 MG tablet Take 10 mg by mouth daily.     fluticasone (FLONASE) 50 MCG/ACT nasal spray Place 1 spray into both nostrils daily. (Patient taking differently: Place 1 spray into both nostrils as needed.)     Multiple Vitamins-Minerals (MULTI-VITAMIN GUMMIES PO) Take 2 each by mouth daily.     Current Facility-Administered Medications  Medication Dose Route Frequency Provider Last Rate Last Admin   0.9 %  sodium chloride infusion  500 mL Intravenous Continuous Charlanne Groom, MD        Allergies as of 10/06/2024 - Review Complete 10/06/2024  Allergen Reaction Noted   Tetracyclines & related Rash 08/14/2013    Family History  Problem Relation Age of Onset   Hypertension Mother    Thyroid  disease Mother    Cancer Mother        GYN   Breast cancer Mother    Hypertension Father    Other Father        was told cardiac  insufficiency- was 83   Healthy Brother    Hypertension Brother    Cancer Cousin        thyroid    Liver disease Neg Hx     Social History   Socioeconomic History   Marital status: Single    Spouse name: n/a   Number of children: 0   Years of education: College   Highest education level: Not on file  Occupational History   Occupation: GIS    Employer: [employer]    Comment: Mapping  Tobacco Use   Smoking status: Never   Smokeless tobacco: Never  Vaping Use   Vaping status: Never Used  Substance and Sexual Activity   Alcohol use: Yes    Alcohol/week: 0.0 standard drinks of alcohol    Comment: on weekends only, once a month prior- now very very rare   Drug use: No   Sexual activity: Not on file  Other Topics Concern   Not on file  Social History Narrative   Single. Not dating. Lives with brother and mom in [place]  most of time in 2025.       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  Physical Exam: Vital signs in last 24 hours: @VSRANGES @   General:   Alert,  Well-developed, well-nourished, pleasant and cooperative in NAD Lungs:  Clear throughout to auscultation.   Heart:  Regular rate and rhythm; no murmurs, clicks, rubs,  or gallops. Abdomen:  Soft, nontender and nondistended. Normal bowel sounds.   Neuro/Psych:  Alert and cooperative. Normal mood and affect. A and O x 3    No significant changes were identified.  The patient continues to be an appropriate candidate for the planned procedure and anesthesia.   Anselm Bring, MD. [facility] 10/06/2024 9:11  AM@

## 2024-10-06 NOTE — Patient Instructions (Signed)
Handout on diverticulosis and hemorrhoids provided.  YOU HAD AN ENDOSCOPIC PROCEDURE TODAY AT THE [facility]:   Refer to the procedure report that was given to you for any specific questions about what was found during the examination.  If the procedure report does not answer your questions, please call your gastroenterologist to clarify.  If you requested that your care partner not be given the details of your procedure findings, then the procedure report has been included in a sealed envelope for you to review at your convenience later.  YOU SHOULD EXPECT: Some feelings of bloating in the abdomen. Passage of more gas than usual.  Walking can help get rid of the air that was put into your GI tract during the procedure and reduce the bloating. If you had a lower endoscopy (such as a colonoscopy or flexible sigmoidoscopy) you may notice spotting of blood in your stool or on the toilet paper. If you underwent a bowel prep for your procedure, you may not have a normal bowel movement for a few days.  Please Note:  You might notice some irritation and congestion in your nose or some drainage.  This is from the oxygen used during your procedure.  There is no need for concern and it should clear up in a day or so.  SYMPTOMS TO REPORT IMMEDIATELY:  Following lower endoscopy (colonoscopy or flexible sigmoidoscopy):  Excessive amounts of blood in the stool  Significant tenderness or worsening of abdominal pains  Swelling of the abdomen that is new, acute  Fever of 100F or higher   For urgent or emergent issues, a gastroenterologist can be reached at any hour by calling [phone]. Do not use MyChart messaging for urgent concerns.    DIET:  We do recommend a small meal at first, but then you may proceed to your regular diet.  Drink plenty of fluids but you should avoid alcoholic beverages for 24 hours.  ACTIVITY:  You should plan to take it easy for the rest of today and you should NOT  DRIVE or use heavy machinery until tomorrow (because of the sedation medicines used during the test).    FOLLOW UP: Our staff will call the number listed on your records the next business day following your procedure.  We will call around 7:15- 8:00 am to check on you and address any questions or concerns that you may have regarding the information given to you following your procedure. If we do not reach you, we will leave a message.     If any biopsies were taken you will be contacted by phone or by letter within the next 1-3 weeks.  Please call us at [phone] if you have not heard about the biopsies in 3 weeks.    SIGNATURES/CONFIDENTIALITY: You and/or your care partner have signed paperwork which will be entered into your electronic medical record.  These signatures attest to the fact that that the information above on your After Visit Summary has been reviewed and is understood.  Full responsibility of the confidentiality of this discharge information lies with you and/or your care-partner.

## 2024-10-06 NOTE — Progress Notes (Signed)
 Sedate, gd SR, tolerated procedure well, VSS, report to RN

## 2024-10-07 ENCOUNTER — Telehealth: Payer: Self-pay

## 2024-10-07 NOTE — Telephone Encounter (Signed)
 No answer, left message to call if having any issues or concerns, B.Vester Titsworth RN

## 2025-02-08 ENCOUNTER — Encounter: Payer: 59 | Admitting: Family Medicine
